# Patient Record
Sex: Female | Born: 1940 | Hispanic: No | Marital: Married | Smoking: Never smoker
Health system: Southern US, Community
[De-identification: ages and names within clinical notes are randomized; demographics above are authoritative.]

## PROBLEM LIST (undated history)

## (undated) DIAGNOSIS — K279 Peptic ulcer, site unspecified, unspecified as acute or chronic, without hemorrhage or perforation: Secondary | ICD-10-CM

## (undated) DIAGNOSIS — I2510 Atherosclerotic heart disease of native coronary artery without angina pectoris: Secondary | ICD-10-CM

## (undated) DIAGNOSIS — K219 Gastro-esophageal reflux disease without esophagitis: Secondary | ICD-10-CM

## (undated) DIAGNOSIS — I350 Nonrheumatic aortic (valve) stenosis: Secondary | ICD-10-CM

## (undated) DIAGNOSIS — R42 Dizziness and giddiness: Secondary | ICD-10-CM

## (undated) DIAGNOSIS — E785 Hyperlipidemia, unspecified: Secondary | ICD-10-CM

## (undated) DIAGNOSIS — I10 Essential (primary) hypertension: Secondary | ICD-10-CM

## (undated) HISTORY — DX: Nonrheumatic aortic (valve) stenosis: I35.0

## (undated) HISTORY — DX: Atherosclerotic heart disease of native coronary artery without angina pectoris: I25.10

## (undated) HISTORY — DX: Peptic ulcer, site unspecified, unspecified as acute or chronic, without hemorrhage or perforation: K27.9

## (undated) HISTORY — DX: Hyperlipidemia, unspecified: E78.5

## (undated) HISTORY — DX: Dizziness and giddiness: R42

## (undated) HISTORY — PX: PTCA: SHX146

## (undated) HISTORY — DX: Gastro-esophageal reflux disease without esophagitis: K21.9

## (undated) HISTORY — DX: Essential (primary) hypertension: I10

---

## 2003-11-19 ENCOUNTER — Encounter: Admission: RE | Admit: 2003-11-19 | Discharge: 2003-11-19 | Payer: Self-pay | Admitting: Internal Medicine

## 2003-12-10 ENCOUNTER — Ambulatory Visit (HOSPITAL_COMMUNITY): Admission: RE | Admit: 2003-12-10 | Discharge: 2003-12-10 | Payer: Self-pay | Admitting: Gastroenterology

## 2004-11-16 ENCOUNTER — Ambulatory Visit (HOSPITAL_COMMUNITY): Admission: RE | Admit: 2004-11-16 | Discharge: 2004-11-16 | Payer: Self-pay | Admitting: Ophthalmology

## 2007-05-17 ENCOUNTER — Other Ambulatory Visit: Admission: RE | Admit: 2007-05-17 | Discharge: 2007-05-17 | Payer: Self-pay | Admitting: Family Medicine

## 2007-06-10 ENCOUNTER — Emergency Department (HOSPITAL_COMMUNITY): Admission: EM | Admit: 2007-06-10 | Discharge: 2007-06-10 | Payer: Self-pay | Admitting: Emergency Medicine

## 2007-11-28 ENCOUNTER — Encounter: Payer: Self-pay | Admitting: Family Medicine

## 2008-01-16 ENCOUNTER — Encounter: Admission: RE | Admit: 2008-01-16 | Discharge: 2008-01-16 | Payer: Self-pay | Admitting: Cardiology

## 2008-01-22 ENCOUNTER — Inpatient Hospital Stay (HOSPITAL_COMMUNITY): Admission: AD | Admit: 2008-01-22 | Discharge: 2008-01-24 | Payer: Self-pay | Admitting: Cardiology

## 2008-01-22 ENCOUNTER — Inpatient Hospital Stay (HOSPITAL_BASED_OUTPATIENT_CLINIC_OR_DEPARTMENT_OTHER): Admission: RE | Admit: 2008-01-22 | Discharge: 2008-01-22 | Payer: Self-pay | Admitting: Cardiology

## 2008-06-09 ENCOUNTER — Inpatient Hospital Stay (HOSPITAL_COMMUNITY): Admission: EM | Admit: 2008-06-09 | Discharge: 2008-06-10 | Payer: Self-pay | Admitting: Emergency Medicine

## 2008-06-10 ENCOUNTER — Encounter (INDEPENDENT_AMBULATORY_CARE_PROVIDER_SITE_OTHER): Payer: Self-pay | Admitting: Internal Medicine

## 2008-11-20 ENCOUNTER — Encounter: Admission: RE | Admit: 2008-11-20 | Discharge: 2008-11-20 | Payer: Self-pay | Admitting: Family Medicine

## 2008-12-21 ENCOUNTER — Encounter: Payer: Self-pay | Admitting: Family Medicine

## 2008-12-22 ENCOUNTER — Encounter: Admission: RE | Admit: 2008-12-22 | Discharge: 2008-12-22 | Payer: Self-pay | Admitting: Gastroenterology

## 2009-02-05 ENCOUNTER — Encounter: Admission: RE | Admit: 2009-02-05 | Discharge: 2009-02-05 | Payer: Self-pay | Admitting: Family Medicine

## 2009-03-30 ENCOUNTER — Encounter: Admission: RE | Admit: 2009-03-30 | Discharge: 2009-03-30 | Payer: Self-pay | Admitting: Gastroenterology

## 2009-05-20 ENCOUNTER — Encounter: Payer: Self-pay | Admitting: Internal Medicine

## 2009-07-02 ENCOUNTER — Encounter: Payer: Self-pay | Admitting: Family Medicine

## 2009-07-27 ENCOUNTER — Encounter: Payer: Self-pay | Admitting: Internal Medicine

## 2009-08-05 ENCOUNTER — Inpatient Hospital Stay (HOSPITAL_COMMUNITY): Admission: EM | Admit: 2009-08-05 | Discharge: 2009-08-10 | Payer: Self-pay | Admitting: Emergency Medicine

## 2009-08-19 ENCOUNTER — Encounter: Payer: Self-pay | Admitting: Internal Medicine

## 2009-08-20 ENCOUNTER — Encounter: Payer: Self-pay | Admitting: Family Medicine

## 2009-08-20 ENCOUNTER — Ambulatory Visit: Payer: Self-pay | Admitting: Internal Medicine

## 2009-08-20 DIAGNOSIS — D649 Anemia, unspecified: Secondary | ICD-10-CM

## 2009-08-20 DIAGNOSIS — I2510 Atherosclerotic heart disease of native coronary artery without angina pectoris: Secondary | ICD-10-CM | POA: Insufficient documentation

## 2009-08-20 DIAGNOSIS — E785 Hyperlipidemia, unspecified: Secondary | ICD-10-CM

## 2009-08-20 DIAGNOSIS — K279 Peptic ulcer, site unspecified, unspecified as acute or chronic, without hemorrhage or perforation: Secondary | ICD-10-CM | POA: Insufficient documentation

## 2009-08-20 DIAGNOSIS — R740 Nonspecific elevation of levels of transaminase and lactic acid dehydrogenase [LDH]: Secondary | ICD-10-CM

## 2009-08-26 ENCOUNTER — Telehealth (INDEPENDENT_AMBULATORY_CARE_PROVIDER_SITE_OTHER): Payer: Self-pay | Admitting: *Deleted

## 2009-08-30 ENCOUNTER — Encounter: Payer: Self-pay | Admitting: Family Medicine

## 2009-08-30 ENCOUNTER — Encounter: Payer: Self-pay | Admitting: Internal Medicine

## 2009-09-01 ENCOUNTER — Ambulatory Visit (HOSPITAL_COMMUNITY): Admission: RE | Admit: 2009-09-01 | Discharge: 2009-09-01 | Payer: Self-pay | Admitting: Gastroenterology

## 2009-09-14 ENCOUNTER — Telehealth: Payer: Self-pay | Admitting: Internal Medicine

## 2009-09-17 ENCOUNTER — Ambulatory Visit: Payer: Self-pay | Admitting: Internal Medicine

## 2009-09-24 ENCOUNTER — Encounter (INDEPENDENT_AMBULATORY_CARE_PROVIDER_SITE_OTHER): Payer: Self-pay | Admitting: *Deleted

## 2010-01-07 ENCOUNTER — Ambulatory Visit: Payer: Self-pay | Admitting: Family Medicine

## 2010-01-07 DIAGNOSIS — I10 Essential (primary) hypertension: Secondary | ICD-10-CM

## 2010-01-10 ENCOUNTER — Ambulatory Visit: Payer: Self-pay | Admitting: Internal Medicine

## 2010-01-17 ENCOUNTER — Encounter: Payer: Self-pay | Admitting: Internal Medicine

## 2010-01-17 ENCOUNTER — Telehealth (INDEPENDENT_AMBULATORY_CARE_PROVIDER_SITE_OTHER): Payer: Self-pay | Admitting: *Deleted

## 2010-01-17 LAB — CONVERTED CEMR LAB
ALT: 23 units/L (ref 0–35)
AST: 29 units/L (ref 0–37)
Basophils Absolute: 0 10*3/uL (ref 0.0–0.1)
Basophils Relative: 0.5 % (ref 0.0–3.0)
Eosinophils Absolute: 0.3 10*3/uL (ref 0.0–0.7)
Eosinophils Relative: 5.7 % — ABNORMAL HIGH (ref 0.0–5.0)
Hemoglobin: 11.8 g/dL — ABNORMAL LOW (ref 12.0–15.0)
Lymphocytes Relative: 24.9 % (ref 12.0–46.0)
MCHC: 34.6 g/dL (ref 30.0–36.0)
Neutro Abs: 3.5 10*3/uL (ref 1.4–7.7)
Neutrophils Relative %: 58.3 % (ref 43.0–77.0)
Platelets: 249 10*3/uL (ref 150.0–400.0)

## 2010-01-19 ENCOUNTER — Telehealth: Payer: Self-pay | Admitting: Family Medicine

## 2010-01-28 ENCOUNTER — Ambulatory Visit: Payer: Self-pay | Admitting: Family Medicine

## 2010-01-28 DIAGNOSIS — M810 Age-related osteoporosis without current pathological fracture: Secondary | ICD-10-CM

## 2010-01-28 LAB — CONVERTED CEMR LAB
BUN: 26 mg/dL — ABNORMAL HIGH (ref 6–23)
Calcium: 9.2 mg/dL (ref 8.4–10.5)
Chloride: 106 meq/L (ref 96–112)
Creatinine, Ser: 1.2 mg/dL (ref 0.4–1.2)
Potassium: 4.1 meq/L (ref 3.5–5.1)
Sodium: 138 meq/L (ref 135–145)
TSH: 4.06 microintl units/mL (ref 0.35–5.50)

## 2010-02-01 ENCOUNTER — Telehealth (INDEPENDENT_AMBULATORY_CARE_PROVIDER_SITE_OTHER): Payer: Self-pay | Admitting: *Deleted

## 2010-02-01 ENCOUNTER — Encounter (INDEPENDENT_AMBULATORY_CARE_PROVIDER_SITE_OTHER): Payer: Self-pay | Admitting: *Deleted

## 2010-03-08 ENCOUNTER — Encounter: Payer: Self-pay | Admitting: Internal Medicine

## 2010-05-06 ENCOUNTER — Encounter: Payer: Self-pay | Admitting: Internal Medicine

## 2010-05-06 ENCOUNTER — Other Ambulatory Visit: Payer: Self-pay | Admitting: Internal Medicine

## 2010-05-06 ENCOUNTER — Ambulatory Visit
Admission: RE | Admit: 2010-05-06 | Discharge: 2010-05-06 | Payer: Self-pay | Source: Home / Self Care | Attending: Internal Medicine | Admitting: Internal Medicine

## 2010-05-06 LAB — CBC WITH DIFFERENTIAL/PLATELET
Basophils Absolute: 0.1 10*3/uL (ref 0.0–0.1)
Basophils Relative: 1 % (ref 0.0–3.0)
Eosinophils Relative: 2.8 % (ref 0.0–5.0)
HCT: 34.1 % — ABNORMAL LOW (ref 36.0–46.0)
Lymphs Abs: 1.3 10*3/uL (ref 0.7–4.0)
Monocytes Absolute: 0.5 10*3/uL (ref 0.1–1.0)
Monocytes Relative: 9.6 % (ref 3.0–12.0)
Neutro Abs: 3.4 10*3/uL (ref 1.4–7.7)
Platelets: 252 10*3/uL (ref 150.0–400.0)
RBC: 3.77 Mil/uL — ABNORMAL LOW (ref 3.87–5.11)
RDW: 12.9 % (ref 11.5–14.6)
WBC: 5.5 10*3/uL (ref 4.5–10.5)

## 2010-05-06 LAB — HEPATIC FUNCTION PANEL
Alkaline Phosphatase: 91 U/L (ref 39–117)
Bilirubin, Direct: 0.1 mg/dL (ref 0.0–0.3)
Total Bilirubin: 0.7 mg/dL (ref 0.3–1.2)

## 2010-05-06 LAB — LIPID PANEL
HDL: 47.6 mg/dL (ref >39.00)
Total CHOL/HDL Ratio: 5
VLDL: 36.6 mg/dL (ref 0.0–40.0)

## 2010-05-09 ENCOUNTER — Ambulatory Visit: Admit: 2010-05-09 | Payer: Self-pay | Admitting: Family Medicine

## 2010-05-09 ENCOUNTER — Encounter: Payer: Self-pay | Admitting: Internal Medicine

## 2010-05-11 NOTE — Progress Notes (Signed)
Summary: labs  Phone Note Outgoing Call   Call placed by: Doristine Devoid CMA,  February 01, 2010 2:38 PM Call placed to: Patient Summary of Call: pt's level is very low.  will need to start 50,000 units weekly x12 weeks and then recheck level   Follow-up for Phone Call        left message on machine ...Marland KitchenMarland KitchenDoristine Devoid CMA  February 01, 2010 2:38 PM   spoke w/ patient aware of labs and that prescription to be sent to pharmacy also mailed copy of labs .......Marland KitchenDoristine Devoid CMA  February 01, 2010 4:17 PM     New/Updated Medications: VITAMIN D (ERGOCALCIFEROL) 50000 UNIT CAPS (ERGOCALCIFEROL) take one tablet weekly x12 weeks Prescriptions: VITAMIN D (ERGOCALCIFEROL) 50000 UNIT CAPS (ERGOCALCIFEROL) take one tablet weekly x12 weeks  #12 x 0   Entered by:   Doristine Devoid CMA   Authorized by:   Neena Rhymes MD   Signed by:   Doristine Devoid CMA on 02/01/2010   Method used:   [facility] [phone][facility] (retail)       [address]       [place], [place]  [address]       Ph: [phone]       Fax: [phone]   RxID:   [id number]

## 2010-05-11 NOTE — Letter (Signed)
Summary: [facility] Physicians Office Visit Note   [facility] Office Visit Note   Imported By: Roderic Ovens 02/09/2010 15:44:29  _____________________________________________________________________  External Attachment:    Type:   Image     Comment:   External Document

## 2010-05-11 NOTE — Assessment & Plan Note (Signed)
Summary: rov/jss   Visit Type:  Follow-up Referring Provider:  Lewayne Bunting   History of Present Illness: Patient has a history of CAD.  She is s/p IWMI in 1995.  Underwent PTCA in [place]. She has been followed by [name] at [place].   In October 2009 she had no significant chest pain but fatigue, didn't feel well.  (This is how she felt in 1995).  She  had a myoview that showed inferior ischemia.  Cath showed:  LM:  normal; LAD:  calcified; LCx 80% distal.  RCA 80% distal before PDA.  She underwent PTCA/DES to the RCa.   She is recently  recuperating from a UGI bleed.    I saw her in mid May.  Since then she has  had another EGD that showed a small gastric erosion Since seen she denies chest pain.  Breathing is ok.  She plans to go to [place] on Sunday  Note that she has not resumed ASA, Plavix or Crestor.   Current Medications (verified): 1)  Pantoprazole Sodium 40 Mg Tbec (Pantoprazole Sodium) .... 1 Tab Two Times A Day 2)  Amlodipine Besylate 10 Mg Tabs (Amlodipine Besylate) .... 1 Tab Once Daily 3)  Ferrous Sulfate 325 (65 Fe) Mg Tabs (Ferrous Sulfate) .... 2 Tab Once Daily 4)  Metoprolol Tartrate 25 Mg Tabs (Metoprolol Tartrate) .... 1 Tab Once Daily 5)  Systane 0.4-0.3 % Soln (Polyethyl Glycol-Propyl Glycol) .... 1 Gtt Ou  Once Daily As Needed 6)  Vitamin A .... 1 Tab Once Daily 7)  Vitamin D 400 Unit Caps (Cholecalciferol) .... 1 Tab Once Daily  Allergies (verified): 1)  ! Simvastatin  Past History:  Past medical, surgical, family and social histories (including risk factors) reviewed, and no changes noted (except as noted below).  Past Medical History: Reviewed history from 08/20/2009 and no changes required.   PAST MEDICAL HISTORY:   1. Hypertension.   2. GERD.   3. CAD  4. Hyperlipidemia.   5. Mild MR,       aortic stenosis.   6. Peptic ulcer disease.   7. Vertigo.   Family History: Reviewed history from 08/19/2009 and no changes required. No family history of  colon cancer, stomach cancer or   any other GI process.   Social History: Reviewed history from 08/20/2009 and no changes required. the patient is married.   She   denies any smoking, drug use or alcohol.   Vital Signs:  Patient profile:   70 year old female Height:      64 inches Weight:      12 4 pounds Pulse rate:   67 / minute BP sitting:   137 / 72  (left arm) Cuff size:   regular  Vitals Entered By: Burnett Kanaris, CNA (September 17, 2009 4:17 PM)  Physical Exam  Additional Exam:  patient is in NAD HEENT:  Normocephalic, atraumatic. EOMI, PERRLA.  Neck: JVP is normal. No thyromegaly. No bruits.  Lungs: clear to auscultation. No rales no wheezes.  Heart: Regular rate and rhythm. Normal S1, S2. No S3.   No significant murmurs. PMI not displaced.  Abdomen:  Supple, nontender. Normal bowel sounds. No masses. No hepatomegaly.  Extremities:   Good distal pulses throughout. No lower extremity edema.  Musculoskeletal :moving all extremities.  Neuro:   alert and oriented x3.    Impression & Recommendations:  Problem # 1:  CAD, NATIVE VESSEL (ICD-414.01) No signs of angina.  I spoke to Dr. Dulce Sellar.  It is OK  to resume ASA 81 mg.  No Plavix. Doing better on 5 mg Amlodipine.  NOt dizzy.  Problem # 2:  HYPERLIPIDEMIA-MIXED (ICD-272.4) I would not resume Crestor until she gets back.  Transaminases will need to be checked prior.  Patient Instructions: 1)  Your physician recommends that you return for lab work in: LFT and CBC . PT. WILL CALL THE OFFICE WHRN HE RETURS FROM A TRIP TO SCHEDULE BLOOD WORK. 2)  New medication: Nitroglycerin o.4 mg  SL as needed for chest pain and Amlodipine 5 mg. Take one tablet  by mouth daily. Prescriptions: AMLODIPINE BESYLATE 5 MG TABS (AMLODIPINE BESYLATE) Take one tablet by mouth daily  #30 x 6   Entered by:   Ollen Gross, RN, BSN   Authorized by:   Sherrill Raring, MD, [facility]   Signed by:   Ollen Gross, RN, BSN on 09/17/2009   Method used:    Electronically to        [facility] [phone]* (retail)       [address]       [place], [place]  [address]       Ph: [phone]       Fax: [phone]   RxID:   [id number] NITROSTAT 0.4 MG SUBL (NITROGLYCERIN) 1 tablet under tongue at onset of chest pain; you may repeat every 5 minutes for up to 3 doses.  #25 x 3   Entered by:   Ollen Gross, RN, BSN   Authorized by:   Sherrill Raring, MD, [facility]   Signed by:   Ollen Gross, RN, BSN on 09/17/2009   Method used:   Electronically to        [facility] [phone]* (retail)       [address]       [place], [place]  [address]       Ph: [phone]       Fax: [phone]   RxID:   [id number]

## 2010-05-11 NOTE — Assessment & Plan Note (Signed)
Summary: 3 month rck/mt   Visit Type:  Follow-up Referring Provider:  Lewayne Bunting   History of Present Illness: Lisa Murillo has a history of CAD.  She is s/p IWMI in 1995.  Underwent PTCA in [place]. She has been followed by [name] at [place].   In October 2009 she had no significant chest pain but fatigue, didn't feel well.  (This is how she felt in 1995).  She  had a myoview that showed inferior ischemia.  Cath showed:  LM:  normal; LAD:  calcified; LCx 80% distal.  RCA 80% distal before PDA.  She underwent PTCA/DES to the RCa.   She is recently  recuperating from a UGI bleed.    I saw her in mid June  At that time I put her back on ASA   Since then she and her husband traveled to [place].  Unfortunately trip was complicated by his suffering a bowel obstruction and requiring surgery.  they ended up staying 3 months. She denies chest pains.  No signif SOB.  SHe is a little tired but they got back from [place] several days ago.  Current Medications (verified): 1)  Pantoprazole Sodium 40 Mg Tbec (Pantoprazole Sodium) .Marland Kitchen.. 1 Tab Two Times A Day 2)  Amlodipine Besylate 5 Mg Tabs (Amlodipine Besylate) .... Take One Tablet By Mouth Daily 3)  Ferrous Sulfate 325 (65 Fe) Mg Tabs (Ferrous Sulfate) .... 2 Tab Once Daily 4)  Metoprolol Tartrate 25 Mg Tabs (Metoprolol Tartrate) .Marland Kitchen.. 1 Tab Once Daily 5)  Systane 0.4-0.3 % Soln (Polyethyl Glycol-Propyl Glycol) .Marland Kitchen.. 1 Gtt Ou  Once Daily As Needed 6)  Vitamin A .Marland Kitchen.. 1 Tab Once Daily 7)  Vitamin D 400 Unit Caps (Cholecalciferol) .Marland Kitchen.. 1 Tab Once Daily 8)  Nitrostat 0.4 Mg Subl (Nitroglycerin) .Marland Kitchen.. 1 Tablet Under Tongue At Onset of Chest Pain; You May Repeat Every 5 Minutes For Up To 3 Doses.  Allergies (verified): 1)  ! Simvastatin  Past History:  Past Medical History: Last updated: 08/20/2009   PAST MEDICAL HISTORY:   1. Hypertension.   2. GERD.   3. CAD  4. Hyperlipidemia.   5. Mild MR,       aortic stenosis.   6. Peptic ulcer disease.   7. Vertigo.     Family History: Last updated: 08/19/2009 No family history of colon cancer, stomach cancer or   any other GI process.   Social History: Last updated: 08/20/2009 the Lisa Murillo is married.   She   denies any smoking, drug use or alcohol.   Vital Signs:  Lisa Murillo profile:   70 year old female Height:      64 inches Weight:      129 pounds Pulse rate:   58 / minute BP sitting:   146 / 73  (left arm) Cuff size:   regular  Vitals Entered By: Burnett Kanaris, CNA (January 10, 2010 2:22 PM)  Physical Exam  Additional Exam:  Lisa Murillo is a petite 70 year old in NAD HEENT:  Normocephalic, atraumatic. EOMI, PERRLA.  Neck: JVP is normal. No thyromegaly. No bruits.  Lungs: clear to auscultation. No rales no wheezes.  Heart: Regular rate and rhythm. Normal S1, S2. No S3.   No significant murmurs. PMI not displaced.  Abdomen:  Supple, nontender. Normal bowel sounds. No masses. No hepatomegaly.  Extremities:   Good distal pulses throughout. No lower extremity edema.  Musculoskeletal :moving all extremities.  Neuro:   alert and oriented x3.    Impression & Recommendations:  Problem # 1:  CAD, NATIVE VESSEL (ICD-414.01) Doing fairly well.  I would keep on same regimen.  Problem # 2:  ANEMIA (ICD-285.9) Repeat CBC today.  Problem # 3:  TRANSAMINASES, SERUM, ELEVATED (ICD-790.4)  will check todaybefore thinking about restarting statin.  Other Orders: TLB-CBC Platelet - w/Differential (85025-CBCD) TLB-Hepatic/Liver Function Pnl (80076-HEPATIC)  Lisa Murillo Instructions: 1)  Your physician recommends that you return for lab work in: lab work today...we will call you with results 2)  Your physician wants you to follow-up in:4 months   You will receive a reminder letter in the mail two months in advance. If you don't receive a letter, please call our office to schedule the follow-up appointment. Prescriptions: PANTOPRAZOLE SODIUM 40 MG TBEC (PANTOPRAZOLE SODIUM) 1 tab one time a day  #30 x 6    Entered by:   Layne Benton, RN, BSN   Authorized by:   Sherrill Raring, MD, [facility]   Signed by:   Layne Benton, RN, BSN on 01/10/2010   Method used:   Electronically to        [facility] [phone]* (retail)       [address]       [place], [place]  [address]       Ph: [phone]       Fax: [phone]   RxID:   [id number]

## 2010-05-11 NOTE — Miscellaneous (Signed)
Summary: [facility]  [facility]   Imported By: Lanelle Bal 01/28/2010 10:47:20  _____________________________________________________________________  External Attachment:    Type:   Image     Comment:   External Document

## 2010-05-11 NOTE — Letter (Signed)
Summary: Primary Care Consult Scheduled Letter  [facility] [place]  [address] [place], [place] [address]   Phone: [phone]  Fax: [phone]      02/01/2010 MRN: [id number]  Lisa Murillo [address] [place], [place]  [address]    Dear [name],    We have scheduled an appointment for you.  At the recommendation of Dr. Neena Rhymes, we have scheduled you for a Bone Density with [facility] on 05-09-2010 at 9:30am.  Their address is [address], [place], [place] [address]. The office phone number is [phone].  If this appointment day and time is not convenient for you, please feel free to call the office of the doctor you are being referred to at the number listed above and reschedule the appointment.    It is important for you to keep your scheduled appointments. We are here to make sure you are given good patient care.   Thank you,    Renee, Patient Care Coordinator [place] at [facility]

## 2010-05-11 NOTE — Progress Notes (Signed)
----   Converted from flag ---- ---- 08/24/2009 4:34 PM, Sherrill Raring, MD, [facility] wrote: GI needs to repeat liver enzymes.  When they are back to normal can resume statin and check LFTs and lipid panel 8 wks after.  ---- 08/23/2009 11:41 AM, Layne Benton, RN, BSN wrote: When do you want her scheduled for Lipid and Liver Panel?? ------------------------------       Additional Follow-up for Phone Call Additional follow up Details #2::    Dr.Ross has advised me that the patient will have her GI doctor send [place] a note when her liver enzymes are normal so she can restart the statin and then we can go ahead and schedule her labs.   Follow-up by: Suzan Garibaldi RN

## 2010-05-11 NOTE — Letter (Signed)
Summary: Appointment - Reminder 2  [facility], Main Office  [address]   [place], [place] [address]   Phone: [phone]  Fax: [phone]     September 24, 2009 MRN: [id number]   Lisa Murillo [address] [place], [place]  [address]   Dear Ms. Borboa,  Our records indicate that it is time to schedule a follow-up appointment with Dr. Tenny Craw in August. It is very important that we reach you to schedule this appointment. We look forward to participating in your health care needs. Please contact us at the number listed above at your earliest convenience to schedule your appointment.  If you are unable to make an appointment at this time, give [place] a call so we can update our records.     Sincerely,   Migdalia Dk [facility] Scheduling Team

## 2010-05-11 NOTE — Assessment & Plan Note (Signed)
Summary: NEW PT TO ESTAB///SPH   Vital Signs:  Patient profile:   70 year old female Height:      64 inches (162.56 cm) Weight:      128.13 pounds (58.24 kg) BMI:     22.07 Temp:     98.0 degrees F (36.67 degrees C) oral BP sitting:   120 / 72  (left arm) Cuff size:   regular  Vitals Entered By: Lucious Groves CMA (January 07, 2010 9:56 AM) CC: New to est--was unhappy with previous MD./kb Is Patient Diabetic? No Pain Assessment Patient in pain? no      Comments Patient would like to discuss her history and exams that may be needed. She notes that Dr Evette Cristal and Dr. Valentina Lucks with [facility] will have her records./kb   History of Present Illness: 70 yo woman here today to establish care.  previous MD- Valentina Lucks at [facility].  1) PUD- had GI bleed, tx'd by Dr Dulce Sellar earlier this year.  now on Protonix two times a day.  uncertain if f/u needed.  bleed was most likely related to plavix use.  denies recent dark, tarry stools or BRBPR  2) HTN- taking amlodipine and the metoprolol w/ good control.  denies CP, SOB, HAs, visual changes, edema  3) CAD- s/p PTCA in [place], on metoprolol.  now seeing Dr Tenny Craw.  4) Hyperlipidemia- date of last labs unknown.  previously on Crestor.  need to review labs, no records available.  Preventive Screening-Counseling & Management  Alcohol-Tobacco     Alcohol drinks/day: 0     Smoking Status: never  Caffeine-Diet-Exercise     Does Patient Exercise: no      Sexual History:  currently monogamous.        Drug Use:  never.    Current Medications (verified): 1)  Pantoprazole Sodium 40 Mg Tbec (Pantoprazole Sodium) .Marland Kitchen.. 1 Tab Two Times A Day 2)  Amlodipine Besylate 5 Mg Tabs (Amlodipine Besylate) .... Take One Tablet By Mouth Daily 3)  Ferrous Sulfate 325 (65 Fe) Mg Tabs (Ferrous Sulfate) .... 2 Tab Once Daily 4)  Metoprolol Tartrate 25 Mg Tabs (Metoprolol Tartrate) .Marland Kitchen.. 1 Tab Once Daily 5)  Systane 0.4-0.3 % Soln (Polyethyl Glycol-Propyl Glycol) .Marland Kitchen.. 1 Gtt Ou   Once Daily As Needed 6)  Vitamin A .Marland Kitchen.. 1 Tab Once Daily 7)  Vitamin D 400 Unit Caps (Cholecalciferol) .Marland Kitchen.. 1 Tab Once Daily 8)  Nitrostat 0.4 Mg Subl (Nitroglycerin) .Marland Kitchen.. 1 Tablet Under Tongue At Onset of Chest Pain; You May Repeat Every 5 Minutes For Up To 3 Doses.  Allergies (verified): 1)  ! Simvastatin  Past History:  Past Medical History: Last updated: 08/20/2009   PAST MEDICAL HISTORY:   1. Hypertension.   2. GERD.   3. CAD  4. Hyperlipidemia.   5. Mild MR,       aortic stenosis.   6. Peptic ulcer disease.   7. Vertigo.   Past Surgical History: PTCA  Social History: the patient is married.  splits time here and in [place] (outside of [place]) 2 children- son lives locally when parents are in town, daughter in [place] She denies any smoking, drug use or alcohol. Smoking Status:  never Does Patient Exercise:  no Sexual History:  currently monogamous Drug Use:  never  Review of Systems      See HPI  Physical Exam  General:  thin but otherwise well appearing Head:  NCAT Eyes:  PERRL, EOMI Neck:  No deformities, masses, or tenderness noted. Lungs:  Normal respiratory effort, chest expands symmetrically. Lungs are clear to auscultation, no crackles or wheezes. Heart:  reg S1/S2 Abdomen:  soft, NT/ND, +BS Pulses:  +2 carotid, radial, DP Extremities:  no C/C/E Neurologic:  alert & oriented X3 and cranial nerves II-XII intact.   Cervical Nodes:  No lymphadenopathy noted Psych:  Oriented X3, normally interactive, good eye contact, not anxious appearing, and not depressed appearing.     Impression & Recommendations:  Problem # 1:  PUD (ICD-533.90) pt following w/ Dr Evette Cristal and Dr Dulce Sellar.  she wasn't sure if GI f/u was required.  will need to review chart.  pt currently on PPI two times a day.  no longer on plavix. Her updated medication list for this problem includes:    Pantoprazole Sodium 40 Mg Tbec (Pantoprazole sodium) .Marland Kitchen... 1 tab one time a day  Problem # 2:   HYPERTENSION, BENIGN ESSENTIAL (ICD-401.1) Assessment: New pt's BP well controlled on current meds.  asymptomatic.  no changes at this time. Her updated medication list for this problem includes:    Amlodipine Besylate 5 Mg Tabs (Amlodipine besylate) .Marland Kitchen... Take one tablet by mouth daily    Metoprolol Tartrate 25 Mg Tabs (Metoprolol tartrate) .Marland Kitchen... 1 tab once daily  Problem # 3:  CAD, NATIVE VESSEL (ICD-414.01) Assessment: Unchanged following w/ Dr Tenny Craw.  s/p PTCA Her updated medication list for this problem includes:    Amlodipine Besylate 5 Mg Tabs (Amlodipine besylate) .Marland Kitchen... Take one tablet by mouth daily    Metoprolol Tartrate 25 Mg Tabs (Metoprolol tartrate) .Marland Kitchen... 1 tab once daily    Nitrostat 0.4 Mg Subl (Nitroglycerin) .Marland Kitchen... 1 tablet under tongue at onset of chest pain; you may repeat every 5 minutes for up to 3 doses.  Problem # 4:  HYPERLIPIDEMIA-MIXED (ICD-272.4) previously on crestor but this was stopped- pt thinks due to increased LFTs.  will need to review records to determine next step.  Pt expresses understanding and is in agreement w/ this plan.  Complete Medication List: 1)  Pantoprazole Sodium 40 Mg Tbec (Pantoprazole sodium) .Marland Kitchen.. 1 tab one time a day 2)  Amlodipine Besylate 5 Mg Tabs (Amlodipine besylate) .... Take one tablet by mouth daily 3)  Ferrous Sulfate 325 (65 Fe) Mg Tabs (Ferrous sulfate) .... 2 tab once daily 4)  Metoprolol Tartrate 25 Mg Tabs (Metoprolol tartrate) .Marland Kitchen.. 1 tab once daily 5)  Systane 0.4-0.3 % Soln (Polyethyl glycol-propyl glycol) .Marland Kitchen.. 1 gtt ou  once daily as needed 6)  Vitamin A  .Marland Kitchen.. 1 tab once daily 7)  Vitamin D 400 Unit Caps (Cholecalciferol) .Marland Kitchen.. 1 tab once daily 8)  Nitrostat 0.4 Mg Subl (Nitroglycerin) .Marland Kitchen.. 1 tablet under tongue at onset of chest pain; you may repeat every 5 minutes for up to 3 doses.  Other Orders: Flu Vaccine 14yrs + MEDICARE PATIENTS (E4540) Administration Flu vaccine - MCR (J8119)  Patient Instructions: 1)  Please  schedule your complete physical in 3 weeks- do not eat before this appt so we can get your labs 2)  We'll get your records and review them 3)  If the do not arrive in the next 10 days- we'll be in touch 4)  Welcome!  We're glad to have you!       Flu Vaccine Consent Questions     Do you have a history of severe allergic reactions to this vaccine? no    Any prior history of allergic reactions to egg and/or gelatin? no    Do you have a sensitivity  to the preservative Thimersol? no    Do you have a past history of Guillan-Barre Syndrome? no    Do you currently have an acute febrile illness? no    Have you ever had a severe reaction to latex? no    Vaccine information given and explained to patient? yes    Are you currently pregnant? no    Lot Number:[id number]   Exp Date:10/08/2010   Site Given  Left Deltoid IMdflu

## 2010-05-11 NOTE — Progress Notes (Signed)
Summary: Records from [place]   Patient spouse notified. Lucious Groves CMA  January 19, 2010 2:25 PM     ---- Converted from flag ---- ---- 01/07/2010 10:30 AM, Neena Rhymes MD wrote: please call pt and let them know whether records have arrived.  if not, they want to go to the old office and pick them up.  thanks! ------------------------------

## 2010-05-11 NOTE — Miscellaneous (Signed)
  Clinical Lists Changes  Medications: Added new medication of CRESTOR 5 MG TABS (ROSUVASTATIN CALCIUM) one half tab every day - Signed Rx of CRESTOR 5 MG TABS (ROSUVASTATIN CALCIUM) one half tab every day;  #30 x 2;  Signed;  Entered by: Layne Benton, RN, BSN;  Authorized by: Sherrill Raring, MD, [facility];  Method used: [facility] [phone]*, [address] [place], [place], [place]  [address], Ph: [phone], Fax: [phone]    Prescriptions: CRESTOR 5 MG TABS (ROSUVASTATIN CALCIUM) one half tab every day  #30 x 2   Entered by:   Layne Benton, RN, BSN   Authorized by:   Sherrill Raring, MD, [facility]   Signed by:   Layne Benton, RN, BSN on 01/17/2010   Method used:   Electronically to        [facility] [phone]* (retail)       [address]       [place], [place]  [address]       Ph: [phone]       Fax: [phone]   RxID:   [id number]

## 2010-05-11 NOTE — Procedures (Signed)
Summary: Upper GI Endoscopy/[facility]  Upper GI Endoscopy/[facility]   Imported By: Lanelle Bal 02/07/2010 13:54:47  _____________________________________________________________________  External Attachment:    Type:   Image     Comment:   External Document

## 2010-05-11 NOTE — Progress Notes (Signed)
Summary: records  Phone Note Call from Patient Call back at Home Phone [phone]   Caller: Spouse Reason for Call: Talk to Nurse Summary of Call: pt did see Dr Dulce Sellar, wants to make we have records for appt on 6/10 Initial call taken by: Migdalia Dk,  September 14, 2009 9:26 AM  Follow-up for Phone Call        Called patient and left message on machine that we did not receive any records from her doctor. Follow-up by: Suzan Garibaldi RN

## 2010-05-11 NOTE — Procedures (Signed)
Summary: Colonoscopy/[facility]  Colonoscopy/[facility]   Imported By: Lanelle Bal 02/07/2010 13:59:51  _____________________________________________________________________  External Attachment:    Type:   Image     Comment:   External Document

## 2010-05-11 NOTE — Letter (Signed)
Summary: Appointment - Reminder 2  [facility], Main Office  [address]   [place], [place] [address]   Phone: [phone]  Fax: [phone]     September 24, 2009 MRN: [id number]   Lisa Murillo [address] [place], [place]  [address]   Dear Ms. Dudash,  Our records indicate that it is time to schedule a follow-up appointment with Dr. Ross in August. It is very important that we reach you to schedule this appointment. We look forward to participating in your health care needs. Please contact us at the number listed above at your earliest convenience to schedule your appointment.  If you are unable to make an appointment at this time, give us a call so we can update our records.     Sincerely,   [facility]ennifer [facility] Scheduling Team 

## 2010-05-11 NOTE — Assessment & Plan Note (Signed)
Summary: cpx /lab/cbs   Vital Signs:  Patient profile:   70 year old female Height:      64 inches Weight:      125 pounds BMI:     21.53 Temp:     98.1 degrees F oral Pulse rate:   73 / minute BP sitting:   110 / 70  (left arm)  Vitals Entered By: Doristine Devoid CMA (January 28, 2010 9:57 AM) CC: yearly exam and labs    History of Present Illness: 70 yo woman here today for CPE.  sore throat- has had sore throat for 2 days, chills.  yesterday started to feel better.  today, no complaints.  Here for Medicare AWV:  1.   Risk factors based on Past M, S, F history: Hyperlipidemia- recently started on Crestor 2.5 mg as directed by cards. HTN- excellent control on Metoprolol and Amlodipine PUD- on protonix daily, has stopped plavix.  Dr Dulce Sellar indicated she should f/u as needed. Osteoporosis- previously on Fosamax, this caused dizziness.  due for bone density. 2.   Physical Activities: walking, housework 3.   Depression/mood: denies depression 4.   Hearing: normal to whispered voice at 6 ft 5.   ADL's: independent 6.   Fall Risk: low risk 7.   Home Safety: feels safe at home, lives w/ husband 8.   Height, weight, &visual acuity: see vitals, vision corrected to 20/20 w/ reading glasses 9.   Counseling: provided on health maintainence, regular exercise, healthy diet 10.   Labs ordered based on risk factors: see A&P 11.           Referral Coordination: see A&P 12.           Care Plan- UTD on mammogram (done in [place]), colonosocpy, has not had bone density test 13.           Cognitive Assessment: linear thought process, memory intact, oriented X3  Preventive Screening-Counseling & Management  Alcohol-Tobacco     Smoking Status: never      Sexual History:  currently monogamous.        Drug Use:  never.    Current Medications (verified): 1)  Pantoprazole Sodium 40 Mg Tbec (Pantoprazole Sodium) .Marland Kitchen.. 1 Tab One Time A Day 2)  Amlodipine Besylate 5 Mg Tabs (Amlodipine Besylate)  .... Take One Tablet By Mouth Daily 3)  Ferrous Sulfate 325 (65 Fe) Mg Tabs (Ferrous Sulfate) .... 2 Tab Once Daily 4)  Metoprolol Tartrate 25 Mg Tabs (Metoprolol Tartrate) .Marland Kitchen.. 1 Tab Once Daily 5)  Systane 0.4-0.3 % Soln (Polyethyl Glycol-Propyl Glycol) .Marland Kitchen.. 1 Gtt Ou  Once Daily As Needed 6)  Vitamin A .Marland Kitchen.. 1 Tab Once Daily 7)  Vitamin D 400 Unit Caps (Cholecalciferol) .Marland Kitchen.. 1 Tab Once Daily 8)  Nitrostat 0.4 Mg Subl (Nitroglycerin) .Marland Kitchen.. 1 Tablet Under Tongue At Onset of Chest Pain; You May Repeat Every 5 Minutes For Up To 3 Doses. 9)  Crestor 5 Mg Tabs (Rosuvastatin Calcium) .... One Half Tab Every Day 10)  Ecotrin Low Strength 81 Mg Tbec (Aspirin) .... Take One Tablet Daily  Allergies (verified): 1)  ! Simvastatin  Past History:  Past medical, surgical, family and social histories (including risk factors) reviewed, and no changes noted (except as noted below).  Past Medical History:   PAST MEDICAL HISTORY:   1. Hypertension.   2. GERD.   3. CAD  4. Hyperlipidemia.   5. Mild MR,       aortic stenosis.   6. Peptic ulcer  disease.   7. Vertigo.   8. Osteoporosis  Past Surgical History: Reviewed history from 01/07/2010 and no changes required. PTCA  Family History: Reviewed history from 08/19/2009 and no changes required. No family history of colon cancer, stomach cancer or   any other GI process.   Social History: Reviewed history from 01/07/2010 and no changes required. the patient is married.  splits time here and in [place] (outside of [place]) 2 children- son lives locally when parents are in town, daughter in [place] She denies any smoking, drug use or alcohol.   Review of Systems  The patient denies anorexia, fever, weight loss, weight gain, vision loss, decreased hearing, hoarseness, chest pain, syncope, dyspnea on exertion, peripheral edema, prolonged cough, headaches, abdominal pain, melena, hematochezia, severe indigestion/heartburn, hematuria, suspicious skin  lesions, depression, abnormal bleeding, enlarged lymph nodes, and breast masses.    Physical Exam  General:  thin but otherwise well appearing Head:  NCAT Eyes:  PERRL, EOMI, fundoscopic exam deferred to ophtho Ears:  External ear exam shows no significant lesions or deformities.  Otoscopic examination reveals clear canals, tympanic membranes are intact bilaterally without bulging, retraction, inflammation or discharge. Hearing is grossly normal bilaterally. Nose:  External nasal examination shows no deformity or inflammation. Nasal mucosa are pink and moist without lesions or exudates. Mouth:  Oral mucosa and oropharynx without lesions or exudates.  Teeth in good repair. Neck:  No deformities, masses, or tenderness noted. Breasts:  deferred Lungs:  Normal respiratory effort, chest expands symmetrically. Lungs are clear to auscultation, no crackles or wheezes. Heart:  reg S1/S2, II/VI SEM Abdomen:  soft, NT/ND, +BS Genitalia:  deferred Pulses:  +2 carotid, radial, DP Extremities:  no C/C/E Neurologic:  No cranial nerve deficits noted. Station and gait are normal. Plantar reflexes are down-going bilaterally. DTRs are symmetrical throughout. Sensory, motor and coordinative functions appear intact. Skin:  Intact without suspicious lesions or rashes Cervical Nodes:  No lymphadenopathy noted Axillary Nodes:  No palpable lymphadenopathy Psych:  Cognition and judgment appear intact. Alert and cooperative with normal attention span and concentration. No apparent delusions, illusions, hallucinations   Impression & Recommendations:  Problem # 1:  PHYSICAL EXAMINATION (ICD-V70.0) Assessment New  pt's PE WNL.  UTD on pap and mammogram per report (has these done in [place]).  will plan on bone density when she returns from [place].  has had flu and pneumovax.  UTD on colonoscopy.  anticipatory guidance provided.  Orders: Medicare -1st Annual Wellness Visit [phone])  Problem # 2:  OSTEOPOROSIS  (ICD-733.00) Assessment: New will check Vit D level.  will arrange DEXA for pt when she returns from [place]. Her updated medication list for this problem includes:    Vitamin D 400 Unit Caps (Cholecalciferol) .Marland Kitchen... 1 tab once daily  Orders: Venipuncture (60454) T-Vitamin D (25-Hydroxy) (09811-91478) Specimen Handling (29562) Radiology Referral (Radiology)  Problem # 3:  HYPERTENSION, BENIGN ESSENTIAL (ICD-401.1) Assessment: Unchanged BP excellently controlled today.  check labs.  no changes. Her updated medication list for this problem includes:    Amlodipine Besylate 5 Mg Tabs (Amlodipine besylate) .Marland Kitchen... Take one tablet by mouth daily    Metoprolol Tartrate 25 Mg Tabs (Metoprolol tartrate) .Marland Kitchen... 1 tab once daily  Orders: Specimen Handling (13086) TLB-BMP (Basic Metabolic Panel-BMET) (80048-METABOL) TLB-TSH (Thyroid Stimulating Hormone) (84443-TSH)  Problem # 4:  PUD (ICD-533.90) Assessment: Unchanged according to Dr Hulen Shouts last note follow up will be as needed.  currently asymptomatic.  will follow. Her updated medication list for this problem includes:  Pantoprazole Sodium 40 Mg Tbec (Pantoprazole sodium) .Marland Kitchen... 1 tab one time a day  Problem # 5:  HYPERLIPIDEMIA-MIXED (ICD-272.4) Assessment: Unchanged pt recently started on statin by cards.  will have f/u LFTs done in [place]. Her updated medication list for this problem includes:    Crestor 5 Mg Tabs (Rosuvastatin calcium) ..... One half tab every day  Complete Medication List: 1)  Pantoprazole Sodium 40 Mg Tbec (Pantoprazole sodium) .Marland Kitchen.. 1 tab one time a day 2)  Amlodipine Besylate 5 Mg Tabs (Amlodipine besylate) .... Take one tablet by mouth daily 3)  Ferrous Sulfate 325 (65 Fe) Mg Tabs (Ferrous sulfate) .... 2 tab once daily 4)  Metoprolol Tartrate 25 Mg Tabs (Metoprolol tartrate) .Marland Kitchen.. 1 tab once daily 5)  Systane 0.4-0.3 % Soln (Polyethyl glycol-propyl glycol) .Marland Kitchen.. 1 gtt ou  once daily as needed 6)  Vitamin A  .Marland Kitchen.. 1  tab once daily 7)  Vitamin D 400 Unit Caps (Cholecalciferol) .Marland Kitchen.. 1 tab once daily 8)  Nitrostat 0.4 Mg Subl (Nitroglycerin) .Marland Kitchen.. 1 tablet under tongue at onset of chest pain; you may repeat every 5 minutes for up to 3 doses. 9)  Crestor 5 Mg Tabs (Rosuvastatin calcium) .... One half tab every day 10)  Ecotrin Low Strength 81 Mg Tbec (Aspirin) .... Take one tablet daily  Patient Instructions: 1)  Please schedule a follow-up appointment in 6 months to recheck blood pressure and cholesterol, sooner if needed. 2)  We'll notify you of your lab results 3)  Keep up the good work!  Your exam looks great! 4)  We'll plan on scheduling a bone density test when you return from [place] 5)  Have a safe trip and a wonderful holiday season!    Orders Added: 1)  Venipuncture [36415] 2)  T-Vitamin D (25-Hydroxy) [phone] 3)  Specimen Handling [99000] 4)  TLB-BMP (Basic Metabolic Panel-BMET) [80048-METABOL] 5)  TLB-TSH (Thyroid Stimulating Hormone) [84443-TSH] 6)  Radiology Referral [Radiology] 7)  Medicare -1st Annual Wellness Visit [G0438] 8)  Est. Patient Level III [91478]   Immunization History:  Pneumovax Immunization History:    Pneumovax:  historical (04/11/2007)   Immunization History:  Pneumovax Immunization History:    Pneumovax:  Historical (04/11/2007)     Preventive Care Screening  Colonoscopy:    Date:  12/10/2003    Results:  normal   Last Pneumovax:    Date:  04/11/2007    Results:  Historical

## 2010-05-11 NOTE — Letter (Signed)
Summary: [facility] Visit Note   [facility] Office Visit Note   Imported By: Roderic Ovens 02/09/2010 15:44:59  _____________________________________________________________________  External Attachment:    Type:   Image     Comment:   External Document

## 2010-05-11 NOTE — Progress Notes (Signed)
  Pt signed ROI, faxed to [facility],recieved records back today gave to [facility]  January 17, 2010 11:33 AM

## 2010-05-11 NOTE — Assessment & Plan Note (Signed)
Summary: np6/jss   Visit Type:  new pt visit Referring Provider:  Lewayne Bunting   History of Present Illness: Lisa Murillo is a 70 year old who presents for a second opinion Patient has a history of CAD.  She is s/p IWMI in 1995.  Underwent PTCA in [place]. She has been followed by [name] at [place].   In October 2009 she had no significant chest pain but fatigue, didn't feel well.  (This is how she felt in 1995).  She  had a myoview that showed inferior ischemia.  Cath showed:  LM:  normal; LAD:  calcified; LCx 80% distal.  RCA 80% distal before PDA.  She underwent PTCA/DES to the RCa.   She was just discharged from [facility].  On May 3rd her Hgb was 6.8.  Endoscopy showed evidence of bleeding in her stomach teated with epi.  She is due to be followed by Dr. Dulce Sellar on 5/23. She was transfused.  Her liver enzymes were elevated and she was taken off Crestor.  Note that she did not have any significant bump in her cardiac enzymes. She was sent home off of ASA and Plavix. Since d/c she says she is slowly improving.  She denies chest pain.  Energy is improving.  Current Medications (verified): 1)  Pantoprazole Sodium 40 Mg Tbec (Pantoprazole Sodium) .Marland Kitchen.. 1 Tab Two Times A Day 2)  Amlodipine Besylate 10 Mg Tabs (Amlodipine Besylate) .Marland Kitchen.. 1 Tab Once Daily 3)  Ferrous Sulfate 325 (65 Fe) Mg Tabs (Ferrous Sulfate) .... 2 Tab Once Daily 4)  Metoprolol Tartrate 25 Mg Tabs (Metoprolol Tartrate) .Marland Kitchen.. 1 Tab Once Daily 5)  Systane 0.4-0.3 % Soln (Polyethyl Glycol-Propyl Glycol) .Marland Kitchen.. 1 Gtt Ou  Once Daily As Needed 6)  Vitamin A .Marland Kitchen.. 1 Tab Once Daily 7)  Vitamin D 400 Unit Caps (Cholecalciferol) .Marland Kitchen.. 1 Tab Once Daily  Allergies (verified): 1)  ! Simvastatin  Past History:  Past Medical History:   PAST MEDICAL HISTORY:   1. Hypertension.   2. GERD.   3. CAD  4. Hyperlipidemia.   5. Mild MR,       aortic stenosis.   6. Peptic ulcer disease.   7. Vertigo.   Social History: the patient  is married.   She   denies any smoking, drug use or alcohol.   Review of Systems       All systems reviewed.  Negative to the above problem except as noted above.  Vital Signs:  Patient profile:   70 year old female Height:      64 inches Weight:      123 pounds BMI:     21.19 Pulse rate:   77 / minute Pulse rhythm:   irregular BP sitting:   100 / 70  (left arm) Cuff size:   large  Vitals Entered By: Danielle Rankin, CMA (Aug 20, 2009 3:23 PM)  Physical Exam  Additional Exam:  patient is in NAD. HEENT:  Normocephalic, atraumatic. EOMI, PERRLA.  Neck: JVP is normal. No thyromegaly. No bruits.  Lungs: clear to auscultation. No rales no wheezes.  Heart: Regular rate and rhythm. Normal S1, S2. No S3.   Gr I-II/VI systolic murmur PMI not displaced.  Abdomen:  Supple, nontender. Normal bowel sounds. No masses. No hepatomegaly.  Extremities:   Good distal pulses throughout. No lower extremity edema.  Musculoskeletal :moving all extremities.  Neuro:   alert and oriented x3.    EKG  Procedure date:  08/20/2009  Findings:  NSR.  77 bpm.  T wave inversion V1-V3.  Impression & Recommendations:  Problem # 1:  PUD (ICD-533.90) Patient with recent GI bleed.  Had been on ASA and Plavix.  Both are now being held She is due to be seen in GI by Dr. Dulce Sellar.  It sounds like she may undergo repeat endoscopy.   Results will determine when she can restart ASA.  Problem # 2:  TRANSAMINASES, SERUM, ELEVATED (ICD-790.4) This will need to be checked again.  If near normal needs to get back on Crestor  Problem # 3:  CAD, NATIVE VESSEL (ICD-414.01) Fortunately she tolerated recent severe anemia without developing unstable angina. Will need to resume ASA and Crestor when possible. BP is a little low today.   She still feels weak.   I told her to decrease amlodipine to 5 mg.  Follow bp. Her updated medication list for this problem includes:    Amlodipine Besylate 10 Mg Tabs (Amlodipine  besylate) .Marland Kitchen... 1 tab once daily    Metoprolol Tartrate 25 Mg Tabs (Metoprolol tartrate) .Marland Kitchen... 1 tab once daily  Problem # 4:  HYPERLIPIDEMIA-MIXED (ICD-272.4) As noted above.  Await repeat labs.  Patient Instructions: 1)  Decrease amlodipine to 5 mg 2)  Keep check of blood pressure.

## 2010-05-12 NOTE — Assessment & Plan Note (Signed)
Summary: 4 month rov.sl   Visit Type:  4 month rov Referring Provider:  Lewayne Bunting  CC:  fatigued.  History of Present Illness: Patient has a history of CAD.  She is s/p IWMI in 1995.  Underwent PTCA in [place]. She has been followed by [name] at [place].   In October 2009 she had no significant chest pain but fatigue, didn't feel well.  (This is how she felt in 1995).  She  had a myoview that showed inferior ischemia.  Cath showed:  LM:  normal; LAD:  calcified; LCx 80% distal.  RCA 80% distal before PDA.  She underwent PTCA/DES to the RCa.   She is recently  recuperating from a UGI bleed.  Current Medications (verified): 1)  Pantoprazole Sodium 40 Mg Tbec (Pantoprazole Sodium) .Marland Kitchen.. 1 Tab One Time A Day 2)  Amlodipine Besylate 5 Mg Tabs (Amlodipine Besylate) .... Take One Tablet By Mouth Daily 3)  Ferrous Sulfate 325 (65 Fe) Mg Tabs (Ferrous Sulfate) .... 2 Tab Once Daily 4)  Metoprolol Tartrate 25 Mg Tabs (Metoprolol Tartrate) .Marland Kitchen.. 1 Tab Once Daily 5)  Systane 0.4-0.3 % Soln (Polyethyl Glycol-Propyl Glycol) .Marland Kitchen.. 1 Gtt Ou  Once Daily As Needed 6)  Vitamin A .Marland Kitchen.. 1 Tab Once Daily 7)  Vitamin D 400 Unit Caps (Cholecalciferol) .Marland Kitchen.. 1 Tab Once Daily 8)  Nitrostat 0.4 Mg Subl (Nitroglycerin) .Marland Kitchen.. 1 Tablet Under Tongue At Onset of Chest Pain; You May Repeat Every 5 Minutes For Up To 3 Doses. 9)  Crestor 5 Mg Tabs (Rosuvastatin Calcium) .... One Half Tab Every Day 10)  Ecotrin Low Strength 81 Mg Tbec (Aspirin) .... Take One Tablet Daily 11)  Vitamin D (Ergocalciferol) 50000 Unit Caps (Ergocalciferol) .... Take One Tablet Weekly X12 Weeks  Allergies (verified): 1)  ! Simvastatin  Review of Systems       Reviewed.  NEg to hte above problem except as noted above.  Vital Signs:  Patient profile:   70 year old female Height:      64 inches Weight:      132.50 pounds BMI:     22.83 Pulse rate:   68 / minute BP sitting:   140 / 72  (left arm) Cuff size:   regular  Vitals Entered By:  Caralee Ates CMA (May 06, 2010 10:22 AM)  Physical Exam  Additional Exam:  Patinet is a petite 70 year old in NAD HEENT:  Normocephalic, atraumatic. EOMI, PERRLA.  Neck: JVP is normal. No thyromegaly. No bruits.  Lungs: clear to auscultation. No rales no wheezes.  Heart: Regular rate and rhythm. Normal S1, S2. No S3.   No significant murmurs. PMI not displaced.  Abdomen:  Supple, nontender. Normal bowel sounds. No masses. No hepatomegaly.  Extremities:   Good distal pulses throughout. No lower extremity edema.  Musculoskeletal :moving all extremities.  Neuro:   alert and oriented x3.    EKG  Procedure date:  05/06/2010  Findings:      NSR.  68 bpm    Impression & Recommendations:  Problem # 1:  CAD, NATIVE VESSEL (ICD-414.01) No symptoms to suggest angina.  Keep on current regimen  Problem # 2:  ANEMIA (ICD-285.9) With fatigue will check CBC  Problem # 3:  HYPERLIPIDEMIA-MIXED (ICD-272.4) Now on crestor.  Will check fasting lipids and liver  Problem # 4:  HYPERTENSION, BENIGN ESSENTIAL (ICD-401.1) Keep on current regimen Her updated medication list for this problem includes:    Amlodipine Besylate 5 Mg Tabs (Amlodipine besylate) .Marland KitchenMarland KitchenMarland KitchenMarland Kitchen  Take one tablet by mouth daily    Metoprolol Tartrate 25 Mg Tabs (Metoprolol tartrate) .Marland Kitchen... 1 tab once daily    Ecotrin Low Strength 81 Mg Tbec (Aspirin) .Marland Kitchen... Take one tablet daily  Problem # 5:  TRANSAMINASES, SERUM, ELEVATED (ICD-790.4) Check labs.  Other Orders: EKG w/ Interpretation (93000) TLB-Lipid Panel (80061-LIPID) TLB-CBC Platelet - w/Differential (85025-CBCD) TLB-Hepatic/Liver Function Pnl (80076-HEPATIC)  Patient Instructions: 1)  Your physician recommends that you return for lab work in: lab work today..we will call you with results 2)  Your physician wants you to follow-up in: 7 months with Dr.Ross  You will receive a reminder letter in the mail two months in advance. If you don't receive a letter, please call our  office to schedule the follow-up appointment.

## 2010-05-18 NOTE — Miscellaneous (Signed)
  Clinical Lists Changes  Medications: Removed medication of CRESTOR 5 MG TABS (ROSUVASTATIN CALCIUM) one half tab every day Added new medication of CRESTOR 10 MG TABS (ROSUVASTATIN CALCIUM) 1 tab every day - Signed Rx of CRESTOR 10 MG TABS (ROSUVASTATIN CALCIUM) 1 tab every day;  #30 x 6;  Signed;  Entered by: Layne Benton, RN, BSN;  Authorized by: Sherrill Raring, MD, [employer];  Method used: [facility] to [facility] [phone]*, [address] [place], [place], [place]  [address], Ph: [phone], Fax: [phone]    Prescriptions: CRESTOR 10 MG TABS (ROSUVASTATIN CALCIUM) 1 tab every day  #30 x 6   Entered by:   Layne Benton, RN, BSN   Authorized by:   Sherrill Raring, MD, [facility]   Signed by:   Layne Benton, RN, BSN on 05/09/2010   Method used:   Electronically to        [facility] [phone]* (retail)       [address]       [place], [place]  [address]       Ph: [phone]       Fax: [phone]   RxID:   [id number]

## 2010-05-25 ENCOUNTER — Encounter: Payer: Self-pay | Admitting: Family Medicine

## 2010-05-25 ENCOUNTER — Ambulatory Visit (INDEPENDENT_AMBULATORY_CARE_PROVIDER_SITE_OTHER)
Admission: RE | Admit: 2010-05-25 | Discharge: 2010-05-25 | Disposition: A | Discharge status: Home / Self Care | Payer: Self-pay | Source: Ambulatory Visit | Attending: Family Medicine | Admitting: Family Medicine

## 2010-05-25 ENCOUNTER — Other Ambulatory Visit: Payer: Self-pay | Admitting: Family Medicine

## 2010-05-25 ENCOUNTER — Inpatient Hospital Stay: Admission: RE | Admit: 2010-05-25 | Payer: Self-pay | Source: Ambulatory Visit

## 2010-05-25 DIAGNOSIS — M810 Age-related osteoporosis without current pathological fracture: Secondary | ICD-10-CM

## 2010-06-01 NOTE — Miscellaneous (Signed)
Summary: dexa and vertebral assessment order  Clinical Lists Changes  Orders: Added new Test order of T-Bone Densitometry [phone]) - Signed Added new Test order of T-Lumbar Vertebral Assessment [phone]) - Signed

## 2010-06-03 ENCOUNTER — Telehealth: Payer: Self-pay | Admitting: Internal Medicine

## 2010-06-03 ENCOUNTER — Telehealth (INDEPENDENT_AMBULATORY_CARE_PROVIDER_SITE_OTHER): Payer: Self-pay | Admitting: *Deleted

## 2010-06-07 NOTE — Progress Notes (Signed)
Summary: refill request  Phone Note Refill Request Message from:  Patient on June 03, 2010 8:59 AM  Refills Requested: Medication #1:  AMLODIPINE BESYLATE 5 MG TABS Take one tablet by mouth daily [facility] [phone]/pt going [place] of [place]-needs 3 month supply   Method Requested: Telephone to Pharmacy Initial call taken by: Glynda Jaeger,  June 03, 2010 8:59 AM    Prescriptions: AMLODIPINE BESYLATE 5 MG TABS (AMLODIPINE BESYLATE) Take one tablet by mouth daily  #90 x 3   Entered by:   Burnett Kanaris, CNA   Authorized by:   Sherrill Raring, MD, [facility]   Signed by:   Burnett Kanaris, CNA on 06/03/2010   Method used:   Electronically to        [facility] [phone]* (retail)       [address]       [place], [place]  [address]       Ph: [phone]       Fax: [phone]   RxID:   [id number]

## 2010-06-07 NOTE — Progress Notes (Addendum)
Summary: dexa results-  Phone Note Outgoing Call   Call placed by: Doristine Devoid CMA,  June 03, 2010 1:09 PM Call placed to: Patient Summary of Call: pt has osteoporosis based on DEXA- needs to start Fosamax 70mg  weekly or can be set up for once yearly reclast infusion for treatment   Follow-up for Phone Call        left message on machine ....Marland KitchenMarland KitchenDoristine Devoid CMA  June 03, 2010 1:09 PM   Additional Follow-up for Phone Call Additional follow up Details #1::        Pt return call and would like to do reclast. Pt notes that she has tried the fosamax previously and it caused her to be dizzy. Pt is to leave the country for a month on monday and will call to let us to know when she is back........Marland KitchenFelecia Deloach CMA  June 03, 2010 4:12 PM

## 2010-06-28 LAB — COMPREHENSIVE METABOLIC PANEL
ALT: 163 U/L — ABNORMAL HIGH (ref 0–35)
ALT: 261 U/L — ABNORMAL HIGH (ref 0–35)
AST: 150 U/L — ABNORMAL HIGH (ref 0–37)
AST: 179 U/L — ABNORMAL HIGH (ref 0–37)
AST: 302 U/L — ABNORMAL HIGH (ref 0–37)
Albumin: 2.9 g/dL — ABNORMAL LOW (ref 3.5–5.2)
Albumin: 3 g/dL — ABNORMAL LOW (ref 3.5–5.2)
Albumin: 3.3 g/dL — ABNORMAL LOW (ref 3.5–5.2)
Alkaline Phosphatase: 51 U/L (ref 39–117)
Alkaline Phosphatase: 58 U/L (ref 39–117)
Alkaline Phosphatase: 96 U/L (ref 39–117)
BUN: 22 mg/dL (ref 6–23)
BUN: 33 mg/dL — ABNORMAL HIGH (ref 6–23)
BUN: 45 mg/dL — ABNORMAL HIGH (ref 6–23)
CO2: 20 mEq/L (ref 19–32)
CO2: 21 mEq/L (ref 19–32)
Calcium: 7.7 mg/dL — ABNORMAL LOW (ref 8.4–10.5)
Calcium: 8.9 mg/dL (ref 8.4–10.5)
Chloride: 114 mEq/L — ABNORMAL HIGH (ref 96–112)
Chloride: 110 mEq/L (ref 96–112)
Chloride: 117 mEq/L — ABNORMAL HIGH (ref 96–112)
Creatinine, Ser: 1.1 mg/dL (ref 0.4–1.2)
Creatinine, Ser: 0.96 mg/dL (ref 0.4–1.2)
Creatinine, Ser: 1.29 mg/dL — ABNORMAL HIGH (ref 0.4–1.2)
GFR calc Af Amer: >60 mL/min (ref >60)
GFR calc Af Amer: 50 mL/min — ABNORMAL LOW (ref >60)
GFR calc Af Amer: >60 mL/min (ref >60)
GFR calc non Af Amer: 49 mL/min — ABNORMAL LOW (ref >60)
GFR calc non Af Amer: 41 mL/min — ABNORMAL LOW (ref >60)
Glucose, Bld: 99 mg/dL (ref 70–99)
Glucose, Bld: 137 mg/dL — ABNORMAL HIGH (ref 70–99)
Potassium: 2.9 mEq/L — ABNORMAL LOW (ref 3.5–5.1)
Potassium: 3.4 mEq/L — ABNORMAL LOW (ref 3.5–5.1)
Potassium: 4.5 mEq/L (ref 3.5–5.1)
Sodium: 137 mEq/L (ref 135–145)
Sodium: 136 mEq/L (ref 135–145)
Total Bilirubin: 1.5 mg/dL — ABNORMAL HIGH (ref 0.3–1.2)
Total Bilirubin: 0.9 mg/dL (ref 0.3–1.2)
Total Bilirubin: 1.4 mg/dL — ABNORMAL HIGH (ref 0.3–1.2)
Total Protein: 4.9 g/dL — ABNORMAL LOW (ref 6.0–8.3)
Total Protein: 6.5 g/dL (ref 6.0–8.3)

## 2010-06-28 LAB — DIFFERENTIAL
Basophils Absolute: 0 10*3/uL (ref 0.0–0.1)
Basophils Absolute: 0 10*3/uL (ref 0.0–0.1)
Basophils Absolute: 0 10*3/uL (ref 0.0–0.1)
Basophils Relative: 0 % (ref 0–1)
Lymphocytes Relative: 22 % (ref 12–46)
Lymphocytes Relative: 28 % (ref 12–46)
Lymphs Abs: 1.5 10*3/uL (ref 0.7–4.0)
Monocytes Absolute: 0.5 10*3/uL (ref 0.1–1.0)
Monocytes Absolute: 0.6 10*3/uL (ref 0.1–1.0)
Neutro Abs: 3.3 10*3/uL (ref 1.7–7.7)
Neutro Abs: 4.8 10*3/uL (ref 1.7–7.7)
Neutro Abs: 4.8 10*3/uL (ref 1.7–7.7)
Neutrophils Relative %: 60 % (ref 43–77)

## 2010-06-28 LAB — CARDIAC PANEL(CRET KIN+CKTOT+MB+TROPI)
CK, MB: 3.3 ng/mL (ref 0.3–4.0)
Relative Index: 2.6 — ABNORMAL HIGH (ref 0.0–2.5)
Total CK: 125 U/L (ref 7–177)
Total CK: 139 U/L (ref 7–177)
Troponin I: 0.01 ng/mL (ref 0.00–0.06)
Troponin I: 0.01 ng/mL (ref 0.00–0.06)

## 2010-06-28 LAB — CBC
HCT: 31.7 % — ABNORMAL LOW (ref 36.0–46.0)
HCT: 29.3 % — ABNORMAL LOW (ref 36.0–46.0)
Hemoglobin: 10.9 g/dL — ABNORMAL LOW (ref 12.0–15.0)
Hemoglobin: 12.1 g/dL (ref 12.0–15.0)
Hemoglobin: 11.9 g/dL — ABNORMAL LOW (ref 12.0–15.0)
Hemoglobin: 9.4 g/dL — ABNORMAL LOW (ref 12.0–15.0)
MCHC: 35.2 g/dL (ref 30.0–36.0)
MCHC: 35.4 g/dL (ref 30.0–36.0)
MCHC: 34.6 g/dL (ref 30.0–36.0)
MCHC: 35.7 g/dL (ref 30.0–36.0)
MCV: 90.2 fL (ref 78.0–100.0)
MCV: 90.7 fL (ref 78.0–100.0)
Platelets: 151 10*3/uL (ref 150–400)
Platelets: 144 10*3/uL — ABNORMAL LOW (ref 150–400)
Platelets: 215 10*3/uL (ref 150–400)
RBC: 3.51 MIL/uL — ABNORMAL LOW (ref 3.87–5.11)
RBC: 3.61 MIL/uL — ABNORMAL LOW (ref 3.87–5.11)
RBC: 3.77 MIL/uL — ABNORMAL LOW (ref 3.87–5.11)
RBC: 3.69 MIL/uL — ABNORMAL LOW (ref 3.87–5.11)
RDW: 15.6 % — ABNORMAL HIGH (ref 11.5–15.5)
RDW: 15.6 % — ABNORMAL HIGH (ref 11.5–15.5)
RDW: 12.6 % (ref 11.5–15.5)
RDW: 16.2 % — ABNORMAL HIGH (ref 11.5–15.5)
WBC: 4.9 10*3/uL (ref 4.0–10.5)
WBC: 5.5 10*3/uL (ref 4.0–10.5)

## 2010-06-28 LAB — CROSSMATCH: DAT, IgG: NEGATIVE

## 2010-06-28 LAB — IRON AND TIBC
Saturation Ratios: 42 % (ref 20–55)
UIBC: 128 ug/dL

## 2010-06-28 LAB — URINE MICROSCOPIC-ADD ON

## 2010-06-28 LAB — BASIC METABOLIC PANEL
BUN: 12 mg/dL (ref 6–23)
BUN: 14 mg/dL (ref 6–23)
CO2: 21 mEq/L (ref 19–32)
CO2: 22 mEq/L (ref 19–32)
Calcium: 8.3 mg/dL — ABNORMAL LOW (ref 8.4–10.5)
Calcium: 8 mg/dL — ABNORMAL LOW (ref 8.4–10.5)
Chloride: 112 mEq/L (ref 96–112)
Chloride: 113 mEq/L — ABNORMAL HIGH (ref 96–112)
Creatinine, Ser: 1.06 mg/dL (ref 0.4–1.2)
Creatinine, Ser: 1.06 mg/dL (ref 0.4–1.2)
GFR calc Af Amer: 58 mL/min — ABNORMAL LOW (ref >60)
GFR calc non Af Amer: 48 mL/min — ABNORMAL LOW (ref >60)
Glucose, Bld: 106 mg/dL — ABNORMAL HIGH (ref 70–99)
Glucose, Bld: 96 mg/dL (ref 70–99)
Glucose, Bld: 90 mg/dL (ref 70–99)
Potassium: 3.7 mEq/L (ref 3.5–5.1)
Potassium: 3.8 mEq/L (ref 3.5–5.1)
Sodium: 138 mEq/L (ref 135–145)

## 2010-06-28 LAB — URINALYSIS, ROUTINE W REFLEX MICROSCOPIC
Bilirubin Urine: NEGATIVE
Glucose, UA: NEGATIVE mg/dL
Hgb urine dipstick: NEGATIVE
Ketones, ur: NEGATIVE mg/dL
Nitrite: NEGATIVE
Protein, ur: 30 mg/dL — AB
Specific Gravity, Urine: 1.024 (ref 1.005–1.030)
Urobilinogen, UA: 0.2 mg/dL (ref 0.0–1.0)
pH: 6 (ref 5.0–8.0)

## 2010-06-28 LAB — LACTIC ACID, PLASMA: Lactic Acid, Venous: 1 mmol/L (ref 0.5–2.2)

## 2010-06-28 LAB — MAGNESIUM: Magnesium: 1.8 mg/dL (ref 1.5–2.5)

## 2010-06-28 LAB — MRSA PCR SCREENING: MRSA by PCR: NEGATIVE

## 2010-06-28 LAB — HEMOCCULT GUIAC POC 1CARD (OFFICE)
Fecal Occult Bld: POSITIVE
Fecal Occult Bld: POSITIVE

## 2010-06-28 LAB — CK TOTAL AND CKMB (NOT AT ARMC)
CK, MB: 4.2 ng/mL — ABNORMAL HIGH (ref 0.3–4.0)
Relative Index: 2.5 (ref 0.0–2.5)

## 2010-06-28 LAB — ANTI-NUCLEAR AB-TITER (ANA TITER): ANA Titer 1: 1:80 {titer} — ABNORMAL HIGH

## 2010-06-28 LAB — HEPATITIS B SURFACE ANTIBODY,QUALITATIVE: Hep B S Ab: NEGATIVE

## 2010-06-28 LAB — TROPONIN I: Troponin I: 0.01 ng/mL (ref 0.00–0.06)

## 2010-06-28 LAB — HEPATITIS C ANTIBODY: HCV Ab: NEGATIVE

## 2010-06-28 LAB — FOLATE: Folate: 15 ng/mL

## 2010-06-28 LAB — RETICULOCYTES: Retic Count, Absolute: 30.9 10*3/uL (ref 19.0–186.0)

## 2010-06-28 LAB — HEMOGLOBIN AND HEMATOCRIT, BLOOD
HCT: 33.5 % — ABNORMAL LOW (ref 36.0–46.0)
Hemoglobin: 12.1 g/dL (ref 12.0–15.0)
Hemoglobin: 12.9 g/dL (ref 12.0–15.0)
Hemoglobin: 6.8 g/dL — CL (ref 12.0–15.0)

## 2010-07-18 ENCOUNTER — Telehealth: Payer: Self-pay | Admitting: Family Medicine

## 2010-07-18 NOTE — Telephone Encounter (Signed)
Patient's son stopped by about info for his dad and his mom---they are back from their trip out of country ---this patient needs to followup on her bone density---earlier phone note in  EMR mentions that patient wants to pursue the reclast--son asks that we call her to talk about bone density tests; Son needs to know if there is any other followup visit that Dr Beverely Low wants (we have note on 01/28/2010 that patient needs a follow-up appointment in 6 months to recheck blood pressure and cholesterol)

## 2010-07-19 NOTE — Telephone Encounter (Signed)
Left message to call office.Pt due for 6 month f/u now.Reclast paperwork started.

## 2010-07-20 NOTE — Telephone Encounter (Signed)
Pt aware appt scheduled awaiting insurance verification

## 2010-07-21 LAB — LIPID PANEL
Cholesterol: 112 mg/dL (ref 0–200)
HDL: 41 mg/dL (ref >39)
Total CHOL/HDL Ratio: 2.7 RATIO
Triglycerides: 87 mg/dL (ref <150)

## 2010-07-21 LAB — CARDIAC PANEL(CRET KIN+CKTOT+MB+TROPI)
Relative Index: 0.7 (ref 0.0–2.5)
Total CK: 216 U/L — ABNORMAL HIGH (ref 7–177)
Total CK: 294 U/L — ABNORMAL HIGH (ref 7–177)
Troponin I: <0.01 ng/mL (ref 0.00–0.06)

## 2010-07-21 LAB — COMPREHENSIVE METABOLIC PANEL
ALT: 27 U/L (ref 0–35)
AST: 31 U/L (ref 0–37)
CO2: 21 mEq/L (ref 19–32)
Chloride: 106 mEq/L (ref 96–112)
GFR calc Af Amer: 44 mL/min — ABNORMAL LOW (ref >60)
GFR calc non Af Amer: 36 mL/min — ABNORMAL LOW (ref >60)
Potassium: 4.2 mEq/L (ref 3.5–5.1)
Sodium: 135 mEq/L (ref 135–145)
Total Bilirubin: 0.9 mg/dL (ref 0.3–1.2)

## 2010-07-21 LAB — PROTIME-INR: Prothrombin Time: 13.5 seconds (ref 11.6–15.2)

## 2010-07-21 LAB — FOLATE: Folate: >20 ng/mL

## 2010-07-21 LAB — POCT CARDIAC MARKERS
CKMB, poc: 5.6 ng/mL (ref 1.0–8.0)
Myoglobin, poc: 205 ng/mL (ref 12–200)
Myoglobin, poc: 214 ng/mL (ref 12–200)
Troponin i, poc: <0.05 ng/mL (ref 0.00–0.09)

## 2010-07-21 LAB — DIFFERENTIAL
Eosinophils Relative: 1 % (ref 0–5)
Lymphocytes Relative: 16 % (ref 12–46)
Lymphs Abs: 1.1 10*3/uL (ref 0.7–4.0)
Monocytes Absolute: 0.4 10*3/uL (ref 0.1–1.0)
Monocytes Relative: 6 % (ref 3–12)

## 2010-07-21 LAB — VITAMIN B12: Vitamin B-12: 267 pg/mL (ref 211–911)

## 2010-07-21 LAB — URINALYSIS, ROUTINE W REFLEX MICROSCOPIC
Bilirubin Urine: NEGATIVE
Ketones, ur: NEGATIVE mg/dL
Nitrite: NEGATIVE
pH: 6.5 (ref 5.0–8.0)

## 2010-07-21 LAB — BASIC METABOLIC PANEL
CO2: 19 mEq/L (ref 19–32)
Calcium: 8 mg/dL — ABNORMAL LOW (ref 8.4–10.5)
Creatinine, Ser: 1.27 mg/dL — ABNORMAL HIGH (ref 0.4–1.2)
GFR calc Af Amer: 51 mL/min — ABNORMAL LOW (ref >60)
Sodium: 135 mEq/L (ref 135–145)

## 2010-07-21 LAB — POCT I-STAT, CHEM 8
BUN: 35 mg/dL — ABNORMAL HIGH (ref 6–23)
Creatinine, Ser: 1.7 mg/dL — ABNORMAL HIGH (ref 0.4–1.2)
Sodium: 140 mEq/L (ref 135–145)
TCO2: 20 mmol/L (ref 0–100)

## 2010-07-21 LAB — CBC
HCT: 30.2 % — ABNORMAL LOW (ref 36.0–46.0)
Hemoglobin: 10.5 g/dL — ABNORMAL LOW (ref 12.0–15.0)
RBC: 3.42 MIL/uL — ABNORMAL LOW (ref 3.87–5.11)
RDW: 12.6 % (ref 11.5–15.5)
WBC: 7.2 10*3/uL (ref 4.0–10.5)

## 2010-07-21 LAB — APTT: aPTT: 32 seconds (ref 24–37)

## 2010-07-21 LAB — MAGNESIUM: Magnesium: 2.4 mg/dL (ref 1.5–2.5)

## 2010-07-21 LAB — RETICULOCYTES: Retic Ct Pct: 1.1 % (ref 0.4–3.1)

## 2010-07-22 ENCOUNTER — Other Ambulatory Visit (INDEPENDENT_AMBULATORY_CARE_PROVIDER_SITE_OTHER): Payer: Medicare Other

## 2010-07-22 DIAGNOSIS — E782 Mixed hyperlipidemia: Secondary | ICD-10-CM

## 2010-07-22 LAB — LIPID PANEL
Cholesterol: 228 mg/dL — ABNORMAL HIGH (ref 0–200)
HDL: 53.3 mg/dL (ref >39.00)
Triglycerides: 179 mg/dL — ABNORMAL HIGH (ref 0.0–149.0)

## 2010-07-22 LAB — AST: AST: 29 U/L (ref 0–37)

## 2010-07-25 ENCOUNTER — Encounter: Payer: Self-pay | Admitting: *Deleted

## 2010-07-25 ENCOUNTER — Encounter: Payer: Self-pay | Admitting: Family Medicine

## 2010-07-29 ENCOUNTER — Ambulatory Visit (INDEPENDENT_AMBULATORY_CARE_PROVIDER_SITE_OTHER): Payer: Medicare Other | Admitting: Family Medicine

## 2010-07-29 VITALS — BP 120/80 | Temp 98.4°F | Wt 134.5 lb

## 2010-07-29 DIAGNOSIS — E785 Hyperlipidemia, unspecified: Secondary | ICD-10-CM

## 2010-07-29 DIAGNOSIS — M25569 Pain in unspecified knee: Secondary | ICD-10-CM

## 2010-07-29 DIAGNOSIS — I10 Essential (primary) hypertension: Secondary | ICD-10-CM

## 2010-07-29 DIAGNOSIS — M810 Age-related osteoporosis without current pathological fracture: Secondary | ICD-10-CM

## 2010-07-29 MED ORDER — ROSUVASTATIN CALCIUM 10 MG PO TABS
10.0000 mg | ORAL_TABLET | Freq: Every day | ORAL | Status: DC
Start: 2010-07-29 — End: 2010-08-15

## 2010-07-29 NOTE — Progress Notes (Signed)
  Subjective:    Patient ID: Lisa Murillo, female    DOB: 02-Feb-1941, 70 y.o.   MRN: [id number]  HPI HTN- chronic problem for pt, well controlled on amlodipine, toprol.  no CP, SOB, HAs, visual changes, edema.  L knee pain- pain in anterior knee.  No swelling or redness.  Had increased walking while in [place].  No bruise.  Pain has been present for 2-3 weeks.  Hyperlipidemia- chronic problem for pt, meds were increased to 10mg  crestor in Jan, no abd pain, N/V.  Some bloating- increased after Crestor dose was increased.  Osteoporosis- forms for Reclast were completed.  Awaiting insurance approval.  Review of Systems For ROS see HPI     Objective:   Physical Exam  Constitutional: She is oriented to person, place, and time. She appears well-developed and well-nourished. No distress.  HENT:  Head: Normocephalic and atraumatic.  Eyes: Conjunctivae and EOM are normal. Pupils are equal, round, and reactive to light.  Neck: Normal range of motion. Neck supple. No thyromegaly present.  Cardiovascular: Normal rate, regular rhythm, normal heart sounds and intact distal pulses.   No murmur heard. Pulmonary/Chest: Effort normal and breath sounds normal. No respiratory distress.  Abdominal: Soft. She exhibits no distension. There is no tenderness.  Musculoskeletal: She exhibits no edema.       Left knee: She exhibits swelling. She exhibits normal range of motion, no deformity, no erythema and normal patellar mobility. tenderness found. Medial joint line tenderness noted. No lateral joint line, no MCL, no LCL and no patellar tendon tenderness noted.       Legs: Lymphadenopathy:    She has no cervical adenopathy.  Neurological: She is alert and oriented to person, place, and time.  Skin: Skin is warm and dry.  Psychiatric: She has a normal mood and affect. Her behavior is normal.          Assessment & Plan:

## 2010-07-29 NOTE — Patient Instructions (Signed)
Follow up in 6 months for your complete physical Call with any questions or concerns We'll forward your lab results to Dr Tenny Craw As soon as we hear from the insurance company we'll let you know about the Reclast Take tylenol for your knee pain and ice for pain relief You look fantastic!!!  Keep up the good work!!!

## 2010-08-01 ENCOUNTER — Encounter: Payer: Self-pay | Admitting: Family Medicine

## 2010-08-01 DIAGNOSIS — M25569 Pain in unspecified knee: Secondary | ICD-10-CM | POA: Insufficient documentation

## 2010-08-01 NOTE — Assessment & Plan Note (Signed)
Pt's lipids have improved since Crestor was increased in January.  Will forward results to Dr Tenny Craw to make sure she is aware.  Will defer changes in management to her as they have been working together on this.

## 2010-08-01 NOTE — Assessment & Plan Note (Signed)
Waiting on insurance to approve reclast injxn.

## 2010-08-01 NOTE — Assessment & Plan Note (Signed)
BP is excellently controlled.  Currently asymptomatic.  Will follow.

## 2010-08-01 NOTE — Assessment & Plan Note (Signed)
Pt's medial joint line tenderness of L knee is consistent w/ osteoarthritis.  Pain in office today is not severe, ambulating well.  Advised heat/ice, elevation, tylenol or ibuprofen prn.  Will follow.

## 2010-08-12 ENCOUNTER — Telehealth: Payer: Self-pay | Admitting: *Deleted

## 2010-08-12 NOTE — Telephone Encounter (Signed)
Pt Reclast is approved needs BMEt prior to infusion. Left message to call the office.

## 2010-08-15 ENCOUNTER — Telehealth: Payer: Self-pay | Admitting: *Deleted

## 2010-08-15 ENCOUNTER — Other Ambulatory Visit (INDEPENDENT_AMBULATORY_CARE_PROVIDER_SITE_OTHER): Payer: Medicare Other

## 2010-08-15 DIAGNOSIS — M810 Age-related osteoporosis without current pathological fracture: Secondary | ICD-10-CM

## 2010-08-15 DIAGNOSIS — E782 Mixed hyperlipidemia: Secondary | ICD-10-CM

## 2010-08-15 LAB — BUN: BUN: 19 mg/dL (ref 6–23)

## 2010-08-15 MED ORDER — ROSUVASTATIN CALCIUM 20 MG PO TABS: 20.0000 mg | ORAL_TABLET | Freq: Every day | ORAL | Status: DC

## 2010-08-15 NOTE — Telephone Encounter (Signed)
Pt and spouse notified of pt Reclast appt at [facility] on 09/07/10 at 2 pm. Pt is aware to drink at least two glasses of water a few hours prior and to be at admitting at 1:45 pm. Pt expressed understanding and will call for sooner appt so she can get back to [place].

## 2010-08-15 NOTE — Telephone Encounter (Signed)
Patient aware to increase Crestor to 20mg  every day.

## 2010-08-18 ENCOUNTER — Ambulatory Visit (HOSPITAL_COMMUNITY): Payer: Medicare Other | Attending: Family Medicine

## 2010-08-18 DIAGNOSIS — M810 Age-related osteoporosis without current pathological fracture: Secondary | ICD-10-CM | POA: Insufficient documentation

## 2010-08-23 NOTE — Cardiovascular Report (Signed)
NAMEJERRA, HUCKEBY            ACCOUNT NO.:  [id number]   MEDICAL RECORD NO.:  [id number]          PATIENT TYPE:  INP   LOCATION:  6527                         FACILITY:  [facility]   PHYSICIAN:  Armanda Magic, M.D.     DATE OF BIRTH:  April 30, 1940   DATE OF PROCEDURE:  01/23/2008  DATE OF DISCHARGE:  01/24/2008                            CARDIAC CATHETERIZATION   REFERRING PHYSICIAN:  Consuella Lose C. Valentina Lucks, MD   PROCEDURE:  Right coronary artery angiography.   OPERATOR:  Armanda Magic, MD and Francisca December, MD   COMPLICATIONS:  None.   IV ACCESS:  Via right femoral artery 6-French sheath.   This a 70 year old female who was admitted for cardiac catheterization  yesterday and we were unable to cannulate the right coronary artery.  She now presents for repeat cath.  We then upgraded for sheath to a 6-  French sheath to try to enable better access to the right coronary  artery.   The patient was brought to cardiac catheterization laboratory in a  fasting nonsedated state.  Informed consent was obtained.  The patient  was connected to continuous heart rate and pulse oximetry monitoring and  intermittent blood pressure might.  The right groin was prepped and  draped in a sterile fashion.  Xylocaine 1% was used for local  anesthesia.  Using a modified Seldinger technique, a 5-French sheath was  placed in right femoral artery.  Under fluoroscopic guidance, a 5-French  JR-4 catheter was placed in the area of the right coronary artery, but  could not cannulate the artery.  The catheter was exchanged over a  guidewire for a 5-French no-torque  right coronary artery catheter, but  again could not engage the coronary ostium.  The catheter was removed  over a guide wire.  A 5-French AL-1 catheter was attempted at engaging  the right coronary ostium under fluoroscopy, but was unsuccessful as  well.  This catheter was exchanged out over a guidewire.  The 5-French  sheath was then exchanged over a  guidewire and upgraded to a 6-French  sheath.  A 6-French JR-4 catheter was then attempted, but could not  cannulate the right coronary ostium.  My colleague Dr. Amil Amen stepped  in at that time and was able to cannulate the right coronary ostium with  an Amplatz catheter.  This revealed a 90% stenosis of the distal RCA.  The patient subsequently went on to PCI of the distal RCA.   RESULTS:  The right coronary artery is heavily calcified throughout its  course distally.  In the distal RCA, there is a 90% lesion before it  bifurcates in the posterior descending artery and posterior lateral  artery.   ASSESSMENT:  Two-vessel obstructive coronary artery disease with an RCA  distal of 80% and a distal left circumflex 80%, too small for  percutaneous coronary intervention.   PLAN:  Percutaneous coronary intervention of the distal right coronary  artery per Dr. Amil Amen.  Start Plavix and continue aspirin.      Armanda Magic, M.D.  Electronically Signed     TT/MEDQ  D:  01/24/2008  T:  01/25/2008  Job:  [id number]   cc:   Gretta Arab. Valentina Lucks, M.D.

## 2010-08-23 NOTE — H&P (Signed)
NAMETAMU, GOLZ NO.:  [id number]   MEDICAL RECORD NO.:  [id number]          PATIENT TYPE:  INP   LOCATION:  6735                         FACILITY:  [facility]   PHYSICIAN:  Ramiro Harvest, MD    DATE OF BIRTH:  [birth year]   DATE OF ADMISSION:  06/09/2008  DATE OF DISCHARGE:                              HISTORY & PHYSICAL   PRIMARY CARE PHYSICIAN:  Dr. Maurice Murillo of [facility].   CARDIOLOGIST:  Dr. Armanda Magic of [facility].   CHIEF COMPLAINT:  Syncope.   HISTORY OF PRESENT ILLNESS:  Lisa Murillo is a pleasant 70 year old  female with history of coronary artery disease, status post PTCI x2,  last one in October 2009, with a drug-eluting stent to the distal RCA,  also a history of hypertension, hyperlipidemia and vertigo, who  presented to the ED with a syncopal episode.  The patient and husband  had been busy all day getting ready for a trip to [place] on the day of  admission.  The patient stated that later on in the afternoon 1-day  prior to admission she felt a little feverish and cold, laid down in the  bed for a few hours.  The patient got up two hours later and went to the  kitchen.  In the kitchen, she felt dizzy and then blacked out.  The  patient does not remember anything else beyond that.  Per husband, he  heard the crash and found the patient on the floor who was initially  unresponsive.  The husband tried to wake her up and could not and called  911, and the patient slowly regained consciousness and was unconscious  for a total of approximately a minute.  The patient endorses some nausea  and emesis en route to the ED, chronic constipation, some chills,  subjective fevers and generalized fatigue.  The patient denies any chest  pain.  No shortness of breath, no palpitations, no diarrhea, no  abdominal pain, no dysuria, no focal neurological symptoms.  No seizure  activity noted.  No recent travel.  The patient in the ED had a CT  of  the head which was negative.  Point of care cardiac markers were  negative x2.  EKG was normal sinus rhythm.  CBC with a hemoglobin of  10.5, otherwise was within normal limits.  I-Stat 8 with a BUN of 35,  creatinine of 1.7.  We were called to admit the patient for further  evaluation and management.   ALLERGIES:  SIMVASTATIN CAUSES A RASH.   PAST MEDICAL HISTORY:  1. Coronary artery disease, status post PTCA to the distal RCA with a      drug-eluding stent on January 22, 2008.  2. Coronary artery disease, status post inferior MI with PTCA in 1995.  3. Hypertension.  4. Hyperlipidemia.  5. Gastroesophageal reflux disease.  6. Vertigo.  7. Osteoporosis.  8. Chronic fatigue.  9. Mild mitral valvular regurgitation.  10.Mild aortic valve sclerosis with murmur.  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 Luciana Axe.  12.Status post shoulder repair x2.   HOME MEDICATIONS:  1. Plavix 75 mg p.o. daily  2. Enteric-coated aspirin 325 mg p.o. daily.  3. Sublingual nitroglycerin p.r.n. chest pain.  4. Crestor 20 mg p.o. daily.  5. Norvasc 10 mg p.o. daily.  6. Iron 65 mg b.i.d.  7. Zetia 10 mg p.o. daily  8. Benicar 20 mg p.o. daily.  9. Hydrochlorothiazide 25 mg p.o. daily.  10.Metoprolol 50 mg p.o. q.a.m. and 25 mg p.o. nightly.   SOCIAL HISTORY:  The patient is a housewife.  No tobacco use.  No  alcohol use.  No IV drug use.  The patient has been living in [place]  half the year and in [place] the other half of the year approximately.   FAMILY HISTORY:  Noncontributory.   REVIEW OF SYSTEMS:  As per HPI, otherwise negative.   PHYSICAL EXAMINATION:  VITAL SIGNS:  Temperature 98.1, blood pressure  125/47, pulse of 60, respiratory rate 20.  Saturating 98% on 2 liters  nasal cannula.  GENERAL:  Patient lying in bed in no apparent distress.  HEENT:  Normocephalic, atraumatic.  Pupils equal, round and reactive to  light and  accommodation.  Extraocular movements intact.  Oropharynx  clear.  No lesions, no exudates.  NECK:  Supple.  No lymphadenopathy.  Dry mucous membranes.  RESPIRATORY:  Lungs are clear to auscultation bilaterally.  No wheezes,  no crackles, no rhonchi.  CARDIOVASCULAR:  Regular rate and rhythm with  a grade 3/6 systolic ejection murmur at the right upper sternal border.  ABDOMEN:  Soft, nontender, nondistended.  Positive bowel sounds.  EXTREMITIES:  No clubbing, cyanosis or edema.  NEUROLOGICAL:  The patient is alert and oriented x3.  Cranial nerves II-  XII are grossly intact.  No focal deficits.   ADMISSION LABORATORY DATA:  CBC - white count 7.2, hemoglobin 10.5,  hematocrit 30.2, MCV of 88.5, platelet count of 209, ANC of 5.6.  I-Stat  8; sodium 140, potassium 4.1, chloride 110, glucose 123, BUN 35,  creatinine 1.7.  Point of care cardiac markers; CK-MB 5.6, troponin-I  less than 0.05, myoglobin of 205.  Next set of point of care cardiac  markers; CK-MB 5.1, troponin-I less than 0.05, myoglobin of 214.  UA is  yellow clear, specific gravity 1.018, pH of 6.5, glucose negative,  bilirubin negative, ketones negative, blood negative, protein negative,  urobilinogen 0.2, nitrite negative, leukocytes negative.  A chest x-ray  obtained showed no acute finding and a low volume chest.  CT of the head  without contrast showed no acute intracranial abnormality, unchanged  mild atrophy, right maxillary sinus disease.  EKG with normal sinus  rhythm.  No change from prior EKGs.   ASSESSMENT AND PLAN:  [name] is a 70 year old female  history of coronary artery disease, status post percutaneous coronary  intervention at x2, history of hypertension, hyperlipidemia, prior  history of vertigo, who presents to the emergency department with a  syncopal episode.   1. Syncope.  Differential includes cardiogenic versus neurocardiogenic      versus orthostatic versus neurological.  Head CT is  negative.  No      focal neurological findings.  No evidence of any seizure activity.      We will admit the patient to telemetry, cycle cardiac enzymes q.8      h., x3, check a 2-D echo to rule out aortic stenosis versus left      ventricular dysfunction.  Check a magnesium level.  Check  a      comprehensive metabolic profile.  Check coags.  Check a fasting      lipid panel.  Check orthostatics.  Hold diuretics for now, IV      fluids and follow.  2. Dehydration.  IV fluids.  3. Coronary artery disease, status post percutaneous coronary      intervention at x2, stable.  Cardiac enzymes q.8 h., x3.  Check a 2-      D echo.  Continue Crestor, Plavix, aspirin and low-dose beta-      blocker and follow.  4. Hypertension.  Hold blood pressure medications.  5. Hyperlipidemia.  Check a fasting lipid panel.  Continue home dose      Crestor and Zetia.  6. Gastroesophageal reflux disease.  Protonix.  7. Vertigo.  8. History of mild mitral regurgitation.  9. Mild aortic valve sclerosis.  10.Acute renal insufficiency, baseline creatinine of 1.11 in October      2009, likely prerenal azotemia secondary to dehydration in the      setting of diuretic use versus renal versus post renal.  Check a      fractional excretion of sodium, hold diuretics and Benicar for now.      Hydrate with IV fluids gently.  If no improvement in her renal      function, we will check a renal ultrasound to rule out      hydronephrosis.  11.Anemia.  Check an anemia panel.  Continue home dose iron.  12.Prophylaxis.  Protonix for gastrointestinal prophylaxis.      Sequential compression devices for deep venous thrombosis      prophylaxis.   It has been a pleasure taking care of [name].      Ramiro Harvest, MD  Electronically Signed     DT/MEDQ  D:  06/09/2008  T:  06/09/2008  Job:  [id number]   cc:   Gretta Arab. Valentina Lucks, M.D.  Armanda Magic, M.D.

## 2010-08-23 NOTE — Cardiovascular Report (Signed)
Lisa Murillo, Lisa Murillo            ACCOUNT NO.:  [id number]   MEDICAL RECORD NO.:  [id number]          PATIENT TYPE:  INP   LOCATION:  6527                         FACILITY:  [facility]   PHYSICIAN:  Armanda Magic, M.D.     DATE OF BIRTH:  [birth year]   DATE OF PROCEDURE:  01/22/2008  DATE OF DISCHARGE:  01/24/2008                            CARDIAC CATHETERIZATION   PROCEDURES:  1. Left cardiac catheterization.  2. Coronary angiography.   INDICATIONS:  Chest pain.   COMPLICATIONS:  None.   IV access via right femoral artery, 4-French sheath.   This is a 70 year old female who has a history of an inferior myocardial  infarction dating back to 1995, at which time she had a PCI of the RCA.  She now presents with chest pain and a reversible defect in the inferior  wall on nuclear myocardial perfusion.  She now presents for cardiac  catheterization.   The patient was brought to the Cardiac Catheterization Laboratory in the  fasting, nonsedated state.  Informed consent was obtained.  The patient  was connected to continuous heart rate and pulse oximetry monitoring and  intermittent blood pressure monitoring.  The right groin was prepped and  draped in sterile fashion.  Xylocaine 1% was used for local anesthesia.  Using modified Seldinger technique, a 4-French sheath was placed in the  right femoral artery.  Under fluoroscopic guidance, a 4-French JL-4  catheter was placed in the left coronary artery.  Multiple cine films  were taken at 30-degree RAO and 40-degree LAO views.  This catheter was  then exchanged out over a guidewire for a 4-French 3-D RCA catheter,  which could not engage the coronary ostium under fluoroscopy.  The  catheter was exchanged out over a guidewire for a 4-French JR-4  catheter, which again could not successfully engage the coronary ostium.  The catheter was removed over a guide wire and under fluoroscopic  guidance, a 4-French angled pigtail catheter was placed in  left  ventricular cavity.  Left ventricular pressure was measured.  Catheter  was then pulled back across the aortic valve with no significant  gradient noted.  Left ventriculography was not performed because of the  patient's underlying renal insufficiency.   The pigtail catheter was then exchanged out over a guidewire for a 4-  [place] AL-1 catheter, which again could not successfully engaged the  coronary ostium.  My colleague, Dr. Lance Muss then stepped in  and tried multiple catheters including a 4-French LCB catheter and a 4-  French Amplatz catheter.  None of which were successful engaging the  coronary ostium.  At the end of procedure, all catheters and sheaths  were removed.  Manual compression was performed until adequate  hemostasis was obtained.  This patient was transferred back into the  room in stable condition.   RESULTS:  Left main coronary artery is widely patent, but calcified and  bifurcates in the left anterior descending artery and left circumflex  artery.   Left anterior descending artery is calcified in its proximal portion,  but is widely patent throughout its course of the apex.  The left circumflex is widely patent in its proximal and midportions.  It gives rise to a first obtuse marginal branch, which is widely patent  and bifurcates into 2 daughter branches.  It then gives rise to a second  obtuse marginal branch, again which is patent.  Just at the takeoff of  the second obtuse marginal branch, there is an 80% narrowing in the  distal left circumflex, which is a very small vessel not amenable to  PCI.   Left ventricular pressure 135/50 mmHg, LVEDP 12 mmHg, aortic pressure  134/62 mmHg.  Of note, the right coronary artery was heavily calcified,  but again unable to cannulate to visualize the anatomy.  The patient did  have chest pain during the study, which was relieved with one sublingual  nitro spray.   ASSESSMENT:  1. One-vessel  obstructive coronary artery disease of a small distal      left circumflex not amenable to percutaneous coronary intervention.      Unable to cannulate the right coronary artery.  2. Normal left ventricular function by nuclear myocardial perfusion      images.  3. No evidence of gradient across the aortic valve.  4. Chest pain.   PLAN:  We will admit to telemetry bed and give IV fluids overnight for  hydration and then plan repeat cardiac catheterization tomorrow to try  to further evaluate the right coronary artery upgrading to a 6-French  sheath.      Armanda Magic, M.D.  Electronically Signed     TT/MEDQ  D:  01/24/2008  T:  01/25/2008  Job:  [id number]   cc:   Gretta Arab. Valentina Lucks, M.D.

## 2010-08-26 NOTE — Op Note (Signed)
NAMETEVIS, CONGER                        ACCOUNT NO.:  [id number]   MEDICAL RECORD NO.:  [id number]                   PATIENT TYPE:  AMB   LOCATION:  ENDO                                 FACILITY[facility]   PHYSICIAN:  Danise Edge, M.D.                DATE OF BIRTH:  1940/08/04   DATE OF PROCEDURE:  12/10/2003  DATE OF DISCHARGE:                                 OPERATIVE REPORT   PROCEDURE:  Colonoscopy.   INDICATIONS FOR PROCEDURE:  [name] is a 70 year old female  born Apr 06, 1941.  To evaluate right lower quadrant abdominal pain, Ms.  Lisa Murillo underwent a CT scan of the abdomen and pelvis on November 18, 2001.  CT scan of the abdomen was normal.  CT scan of the pelvis revealed a  possible  mass in the base of her cecum.  The distal ileum and appendix  appeared normal.  [name] tells me that her right lower quadrant  abdominal discomfort has essentially resolved.   ENDOSCOPIST:  Charolett Bumpers, M.D.   PREMEDICATION:  Demerol 30 mg, Versed 5 mg.   PROCEDURE:  After obtaining confirmed consent, [name] was placed in the  left lateral decubitus position.  I administered intravenous Demerol and  intravenous Versed to achieve conscious sedation for the procedure.  The  patient's blood pressure, oxygen saturation, and cardiac rhythm were  monitored throughout the procedure and documented in the medical record.  Anal inspection and digital rectal exam were normal.  The Olympus adjustable  pediatric video colonoscope was introduced into the rectum and advanced to  the cecum.  A normal appearing ileocecal valve was intubated and the distal  ileum inspected.  Colonic preparation for the exam today was excellent.   Rectum normal.  Sigmoid colon and descending colon normal.  Splenic flexure normal.  Transverse colon normal.  Hepatic flexure normal.  Ascending colon normal.  Cecum and ileocecal valve normal.  Distal ileum normal.   ASSESSMENT:  Normal  proctocolonoscopy to the cecum with intubation of a  normal appearing ileocecal valve and inspection of the distal ileum.  Photographs were made of the cecal base and the normal ileocecal valve.                                               Danise Edge, M.D.    MJ/MEDQ  D:  12/10/2003  T:  12/10/2003  Job:  [id number]   cc:   Sharlet Salina, M.D.  [address]  [place]  [place] [address]  Fax: [phone]

## 2010-08-26 NOTE — Discharge Summary (Signed)
Lisa Murillo, Lisa Murillo            ACCOUNT NO.:  [id number]   MEDICAL RECORD NO.:  [id number]          PATIENT TYPE:  INP   LOCATION:  6527                         FACILITY:  [facility]   PHYSICIAN:  Armanda Magic, M.D.     DATE OF BIRTH:  [birth year]   DATE OF ADMISSION:  01/22/2008  DATE OF DISCHARGE:  01/24/2008                               DISCHARGE SUMMARY   DISCHARGE DIAGNOSES:  1. Coronary artery disease, status post percutaneous intervention of      the distal right coronary artery utilizing an Endeavor stent, drug      eluting.  2. Hyperlipidemia.  3. Hypertension.  4. Long-term medication use.   HOSPITAL COURSE:  Lisa Murillo is a 70 year old female, who has known  coronary artery disease.  She had been having chest pain and underwent a  Cardiolite study that showed reversible defect in the inferior wall.  She was admitted on January 22, 2008, for cardiac catheterization.   Dr. Mayford Knife took the patient to the cath lab and was able to investigate  the left coronary artery system.  There was one-vessel obstructive  coronary artery disease of a small distal left circumflex unamenable to  percutaneous intervention.  She was unable to cannulate the right  coronary artery.  At this time, the patient was kept in the hospital  overnight.  The next day, she was brought back to the catheterization  lab and Dr. Amil Amen performed right coronary angiography.  The patient  was found to have a 90% distal right coronary artery lesion.   Because of this lesion, it was felt necessary that the patient needed  percutaneous intervention and a drug-eluting stent was implanted.  The  patient remained in the hospital overnight and was ready for discharge  to home the following day.   LAB STUDIES:  Sodium 138, potassium 4.1, BUN 12, creatinine 1.1, and  platelets 213.   DISCHARGE MEDICATIONS:  1. Plavix 75 mg a day.  2. Enteric-coated aspirin 325 mg a day.  3. Sublingual nitroglycerin p.r.n.  chest pain.  4. Crestor 20 mg a day.  5. Norvasc 10 mg a day.  6. Iron twice a day.  7. Zetia 10 mg a day.  8. Benicar 20 mg a day.  9. Hydrochlorothiazide 25 mg a day.  10.Metoprolol 25 mg twice a day.   Stop Nexium.   The patient is to clean cath site gently with soap and water.  No  scrubbing.  Remain on a low-sodium, heart-healthy diet.  No driving for  2 days.  No lifting over 10 pounds for 1 week.  Follow up with Dr.  Mayford Knife on February 06, 2008, at 10:00 a.m.      Guy Franco, P.A.      Armanda Magic, M.D.  Electronically Signed    LB/MEDQ  D:  01/31/2008  T:  02/01/2008  Job:  [id number]

## 2010-08-26 NOTE — Op Note (Signed)
Lisa Murillo, Lisa Murillo            ACCOUNT NO.:  [id number]   MEDICAL RECORD NO.:  [id number]          PATIENT TYPE:  INP   LOCATION:  2550                         FACILITY:  [facility]   PHYSICIAN:  Alford Highland. Rankin, M.D.   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   POSTOPERATIVE DIAGNOSIS:  Epiretinal membrane with significant distortion  and visual acuity impairment in the left eye.   PROCEDURE:  Posterior vitrectomy and membrane peel, left eye, 25 gauge.   SURGEON:  Alford Highland. Rankin, M.D.   ANESTHESIA:  Local retrobulbar and monitored anesthesia care.   INDICATIONS FOR PROCEDURE:  The patient is a 70 year old woman who has  profound vision loss on the basis of epiretinal membrane with macular  topographic distortion.  The patient understands this is an attempt to  release macular topographic distortion so as to allow for enhanced visual  acuity and acuity improvement.  She understands the risks of anesthesia  including the rare occurrence of death as well as to the eye including but  not limited to hemorrhage, infection, scarring, need for further surgery, no  change in vision, loss of vision, progression of disease despite  intervention.  After appropriate signed consent was obtained, the patient  was taken to the operating room.   DESCRIPTION OF PROCEDURE:  In the operating room, appropriate monitoring was  followed by mild sedation.  0.75% Marcaine was delivered 5 mL retrobulbar  and an additional 5 mL laterally in the fashion of modified Darel Hong.  The  left periocular region was sterilely prepped and draped in the usual  ophthalmic fashion.  A lid speculum was applied.  The microscope was placed  into position.  A 25 gauge trocar system was used to engage the infusion.  Placement in the vitreous cavity was verified visually.  A superior trocar  was placed.  A core vitrectomy was then begun.  The vitreous skirt trimmed  and circumscribed 360 degrees.  No complications occurred.  Membrane forceps  were then used to engage the internal limiting membrane, this was removed as  a continuous single sheet off the entire macular region without difficulty.  No complications occurred.  The peripheral retina was inspected and found to  be free of retinal holes and tears.  Further vitrectomy was carried out at  the sites of the trocar placements superiorly so as to minimize vitreous  incarceration.  Under low infusion pressures, the trocars were removed.  Under the low infusion pressure, the infusion was removed.  Subconjunctival  Decadron was applied.  A sterile patch and Fox shield applied to the left  eye.  The patient tolerated the procedure well without complications.     Alford Highland Rankin, M.D.  Electronically Signed    GAR/MEDQ  D:  11/16/2004  T:  11/16/2004  Job:  [id number]

## 2010-09-09 ENCOUNTER — Ambulatory Visit (INDEPENDENT_AMBULATORY_CARE_PROVIDER_SITE_OTHER): Payer: Medicare Other | Admitting: Family Medicine

## 2010-09-09 DIAGNOSIS — M810 Age-related osteoporosis without current pathological fracture: Secondary | ICD-10-CM

## 2010-09-09 DIAGNOSIS — R42 Dizziness and giddiness: Secondary | ICD-10-CM

## 2010-09-09 DIAGNOSIS — K219 Gastro-esophageal reflux disease without esophagitis: Secondary | ICD-10-CM | POA: Insufficient documentation

## 2010-09-09 LAB — CBC WITH DIFFERENTIAL/PLATELET
Eosinophils Absolute: 0.1 10*3/uL (ref 0.0–0.7)
Eosinophils Relative: 1 % (ref 0–5)
Lymphs Abs: 1.4 10*3/uL (ref 0.7–4.0)
MCH: 29.6 pg (ref 26.0–34.0)
MCV: 90.5 fL (ref 78.0–100.0)
Monocytes Relative: 9 % (ref 3–12)
Platelets: 251 10*3/uL (ref 150–400)
RBC: 3.89 MIL/uL (ref 3.87–5.11)

## 2010-09-09 NOTE — Assessment & Plan Note (Signed)
May be related to visual field input changes due to retinitis.  No abnormalities on exam today.  Check electrolytes and blood count.  Reviewed supportive care and red flags that should prompt return.  Pt expressed understanding and is in agreement w/ plan.

## 2010-09-09 NOTE — Progress Notes (Signed)
  Subjective:    Patient ID: Lisa Murillo, female    DOB: 04-Jun-1940, 70 y.o.   MRN: [id number]  HPI Since reclast infusion has been having bone aches, dizziness, fatigue, GERD.  Started 2 days after infusion.  The bone ache and fatigue has improved.  Still having dizziness.  Has recently increased Crestor to 20mg  nightly.  GERD- pt is not sure whether she is still taking protonix or not.  Reports sxs are worse w/ 'certain foods'.  Has been more noticeable since Reclast infusion.  Eye infxn- pt apparently is currently being treated for a retinitis by ophtho.  Has been on prescription drops x3 and she reports the side effects are bothersome- dry mouth.  Isn't sure whether her eye infection is contributing to her dizziness.   Review of Systems For ROS see HPI     Objective:   Physical Exam  Constitutional: She is oriented to person, place, and time. She appears well-developed and well-nourished. No distress.  HENT:  Head: Normocephalic and atraumatic.  Eyes: Conjunctivae and EOM are normal. Pupils are equal, round, and reactive to light.  Neck: Normal range of motion. Neck supple. No thyromegaly present.  Cardiovascular: Normal rate, regular rhythm and intact distal pulses.   Murmur (II/VI SEM) heard. Pulmonary/Chest: Effort normal and breath sounds normal. No respiratory distress.  Abdominal: Soft. She exhibits no distension. There is no tenderness.  Musculoskeletal: She exhibits no edema.  Lymphadenopathy:    She has no cervical adenopathy.  Neurological: She is alert and oriented to person, place, and time.  Skin: Skin is warm and dry.  Psychiatric: She has a normal mood and affect. Her behavior is normal.          Assessment & Plan:

## 2010-09-09 NOTE — Progress Notes (Signed)
Addended by: Candie Echevaria L on: 09/09/2010 05:27 PM   Modules accepted: Orders

## 2010-09-09 NOTE — Patient Instructions (Signed)
Please call me and let me know if you are still taking the Prontonix for your heartburn Take the Nexium samples for your heartburn (but do not take both the Protonix and Nexium) We'll notify you of your lab results Increase your fluid intake Tylenol as needed for pain Call with any questions or concerns Hang in there!

## 2010-09-09 NOTE — Progress Notes (Signed)
Addended byDuaine Dredge, Abdi Husak L on: 09/09/2010 03:00 PM   Modules accepted: Orders

## 2010-09-09 NOTE — Progress Notes (Signed)
Addended by: Candie Echevaria L on: 09/09/2010 03:03 PM   Modules accepted: Orders

## 2010-09-09 NOTE — Assessment & Plan Note (Signed)
Pt had flu like sxs after Reclast infusion.  Will need to determine next year whether pt would like repeat infusion.  Husband is in favor of it, pt isn't sure.  Will follow.

## 2010-09-09 NOTE — Assessment & Plan Note (Signed)
Samples of Nexium given while pt checks on whether she is still on Protonix.  Reviewed lifestyle modifications and trigger avoidance.  Will follow.

## 2010-09-12 ENCOUNTER — Telehealth: Payer: Self-pay | Admitting: *Deleted

## 2010-09-12 ENCOUNTER — Encounter: Payer: Self-pay | Admitting: *Deleted

## 2010-09-12 LAB — BASIC METABOLIC PANEL
BUN: 22 mg/dL (ref 6–23)
Creatinine, Ser: 1.5 mg/dL — ABNORMAL HIGH (ref 0.4–1.2)
GFR: 37.65 mL/min — ABNORMAL LOW (ref >60.00)
Glucose, Bld: 100 mg/dL — ABNORMAL HIGH (ref 70–99)
Potassium: 5.3 mEq/L — ABNORMAL HIGH (ref 3.5–5.1)

## 2010-09-12 LAB — HEPATIC FUNCTION PANEL
ALT: 17 U/L (ref 0–35)
Alkaline Phosphatase: 72 U/L (ref 39–117)
Bilirubin, Direct: 0.1 mg/dL (ref 0.0–0.3)
Total Bilirubin: 0.6 mg/dL (ref 0.3–1.2)

## 2010-09-12 NOTE — Telephone Encounter (Signed)
Left message to call office

## 2010-09-12 NOTE — Progress Notes (Signed)
Addended by: Candie Echevaria L on: 09/12/2010 10:11 AM   Modules accepted: Orders

## 2010-09-12 NOTE — Telephone Encounter (Signed)
Message copied by Verdene Rio on Mon Sep 12, 2010 10:56 AM ------      Message from: Sheliah Hatch      Created: Mon Sep 12, 2010 10:35 AM       If she continues to have severe heartburn she should follow up with her GI doctor b/c she has a history of ulcers and GI bleed.      ----- Message -----         From: Candie Echevaria, CMA         Sent: 09/12/2010  10:11 AM           To: Neena Rhymes, MD            Pt wanted to let you know that she checked her med and she is taking protonix

## 2010-09-12 NOTE — Telephone Encounter (Signed)
Discuss with patient  

## 2010-09-16 ENCOUNTER — Other Ambulatory Visit: Payer: Medicare Other

## 2010-09-19 ENCOUNTER — Other Ambulatory Visit (INDEPENDENT_AMBULATORY_CARE_PROVIDER_SITE_OTHER): Payer: Medicare Other

## 2010-09-19 DIAGNOSIS — N289 Disorder of kidney and ureter, unspecified: Secondary | ICD-10-CM

## 2010-09-19 LAB — BASIC METABOLIC PANEL
CO2: 21 mEq/L (ref 19–32)
Calcium: 8.6 mg/dL (ref 8.4–10.5)
Creatinine, Ser: 1.9 mg/dL — ABNORMAL HIGH (ref 0.4–1.2)
Glucose, Bld: 121 mg/dL — ABNORMAL HIGH (ref 70–99)

## 2010-09-19 NOTE — Progress Notes (Signed)
Addended by: Lucious Groves I on: 09/19/2010 04:33 PM   Modules accepted: Orders

## 2010-09-19 NOTE — Progress Notes (Signed)
Labs only

## 2010-10-17 ENCOUNTER — Encounter: Payer: Self-pay | Admitting: Internal Medicine

## 2010-10-18 ENCOUNTER — Other Ambulatory Visit (INDEPENDENT_AMBULATORY_CARE_PROVIDER_SITE_OTHER): Payer: Medicare Other | Admitting: *Deleted

## 2010-10-18 DIAGNOSIS — E785 Hyperlipidemia, unspecified: Secondary | ICD-10-CM

## 2010-10-18 LAB — LIPID PANEL
Cholesterol: 195 mg/dL (ref 0–200)
LDL Cholesterol: 124 mg/dL — ABNORMAL HIGH (ref 0–99)
Total CHOL/HDL Ratio: 4

## 2010-10-24 ENCOUNTER — Other Ambulatory Visit: Payer: Self-pay | Admitting: *Deleted

## 2010-10-24 DIAGNOSIS — M2550 Pain in unspecified joint: Secondary | ICD-10-CM

## 2010-11-15 ENCOUNTER — Other Ambulatory Visit (INDEPENDENT_AMBULATORY_CARE_PROVIDER_SITE_OTHER): Payer: Medicare Other | Admitting: *Deleted

## 2010-11-15 DIAGNOSIS — M2550 Pain in unspecified joint: Secondary | ICD-10-CM

## 2010-11-24 ENCOUNTER — Other Ambulatory Visit: Payer: Self-pay | Admitting: *Deleted

## 2010-11-24 DIAGNOSIS — E782 Mixed hyperlipidemia: Secondary | ICD-10-CM

## 2010-11-24 MED ORDER — EZETIMIBE 10 MG PO TABS: 10.0000 mg | ORAL_TABLET | Freq: Every day | ORAL | Status: DC

## 2010-11-24 MED ORDER — ROSUVASTATIN CALCIUM 5 MG PO TABS: 5.0000 mg | ORAL_TABLET | Freq: Every day | ORAL | Status: DC

## 2010-12-19 IMAGING — CT CT PELVIS W/ CM
2 of 5 series · 14 of 32 positions shown, 19 images · IV contrast (30CC OMNI 350 & [ID] OMNI 300)
Comparison: Lung bases are clear.

CLINICAL DATA: Abdominal pain, weight loss, constipation loss of
appetite

CT ABDOMEN AND PELVIS WITH CONTRAST
TECHNIQUE: Multidetector CT imaging of the abdomen and pelvis was
performed using the standard protocol following bolus
administration of intravenous contrast.
Contrast: 100 ml Omnipaque 300

[Series 2: abdomen w/ · axial · 0.70mm/px · z∈[-263,+27]mm · 6 of 82 slices shown, 11 images]
[im 12/82  soft-tissue]
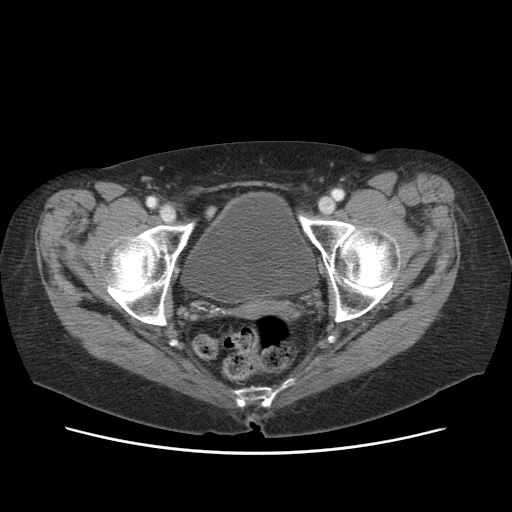
[im 12/82  bone]
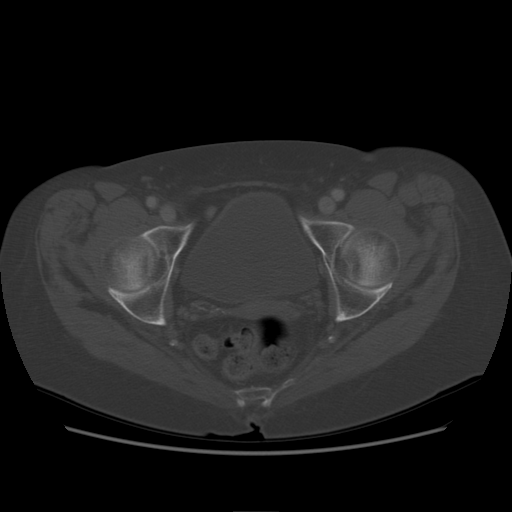
[im 24/82  soft-tissue]
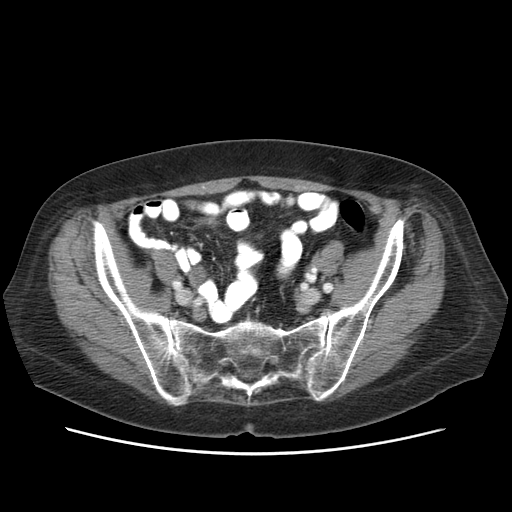
[im 35/82  soft-tissue]
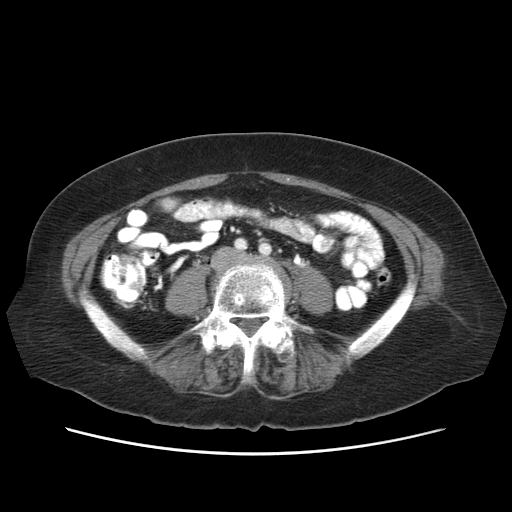
[im 35/82  lung]
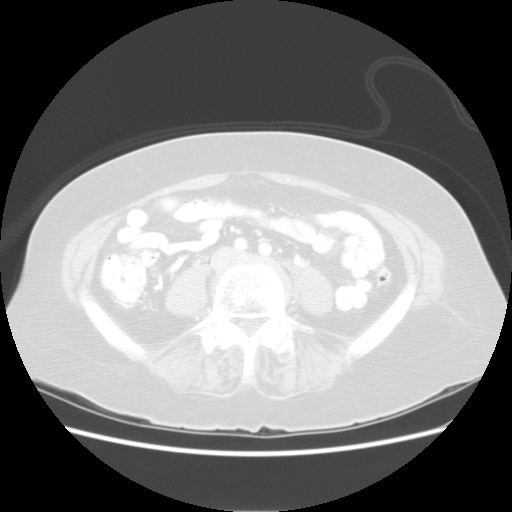
[im 47/82  soft-tissue]
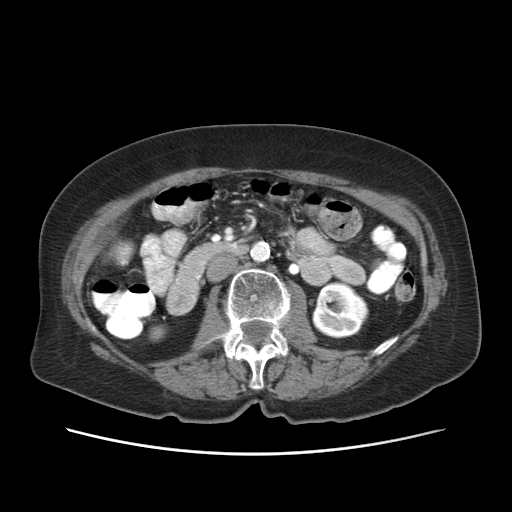
[im 47/82  lung]
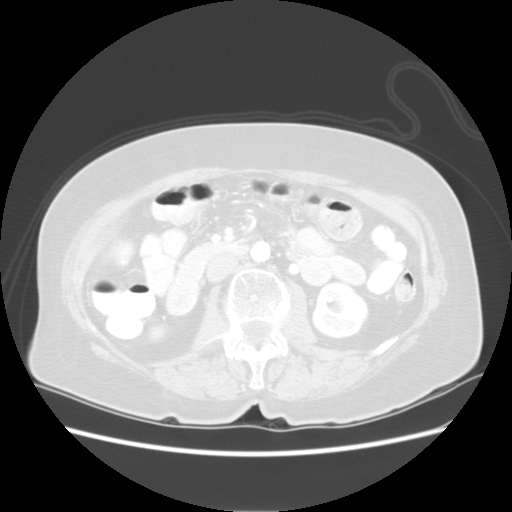
[im 58/82  soft-tissue]
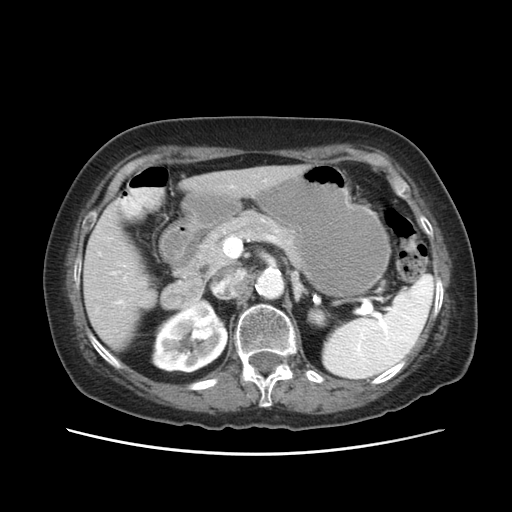
[im 58/82  lung]
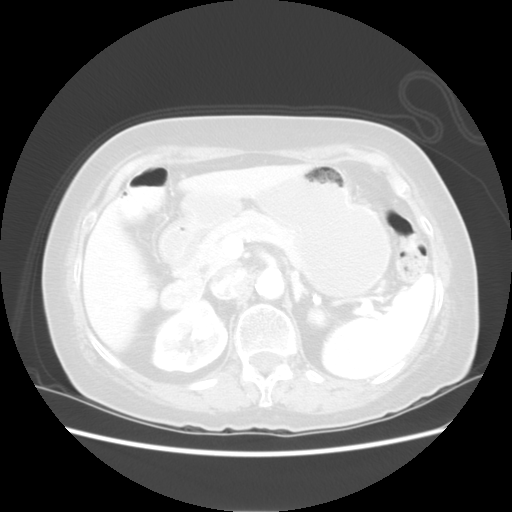
[im 70/82  soft-tissue]
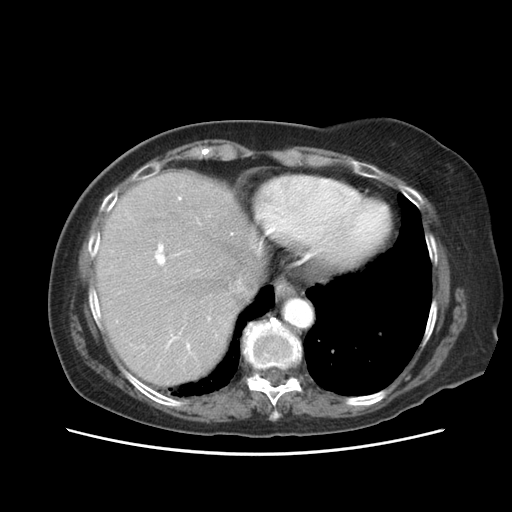
[im 70/82  lung]
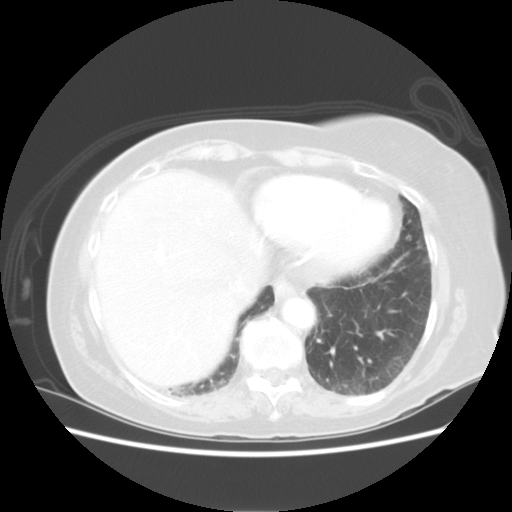

[Series 401: sagittal · sagittal · 0.84mm/px · 8 of 136 slices shown]
[im 13/136  soft-tissue]
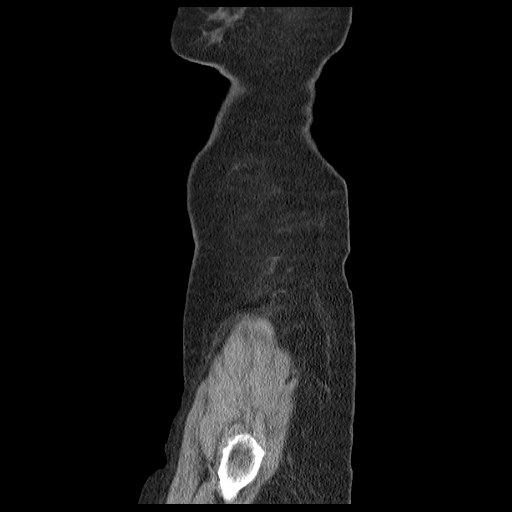
[im 25/136  soft-tissue]
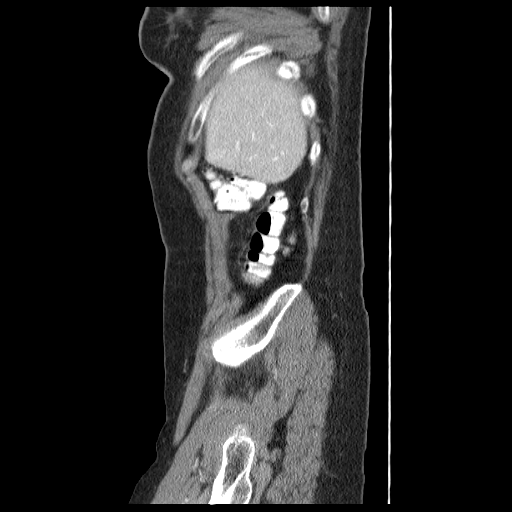
[im 50/136  soft-tissue]
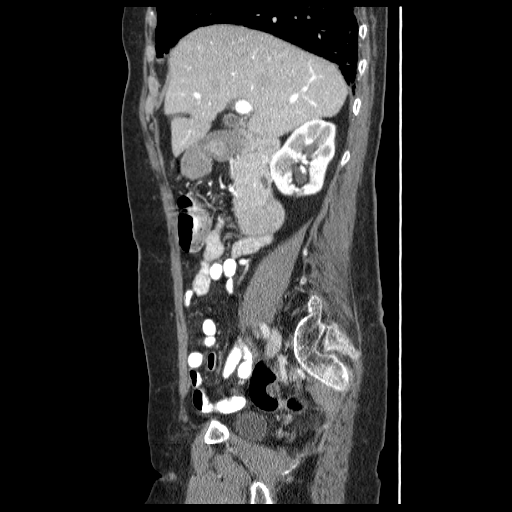
[im 62/136  soft-tissue]
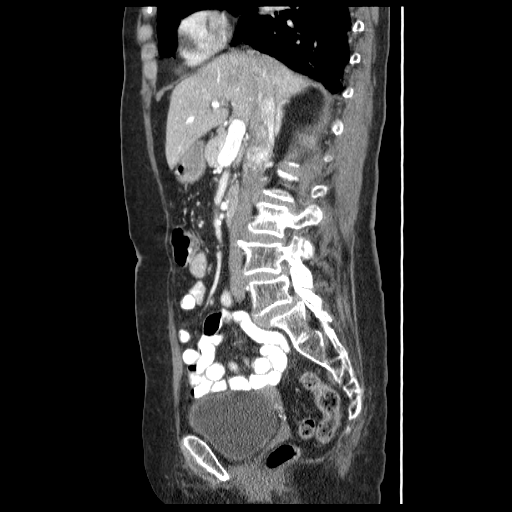
[im 74/136  soft-tissue]
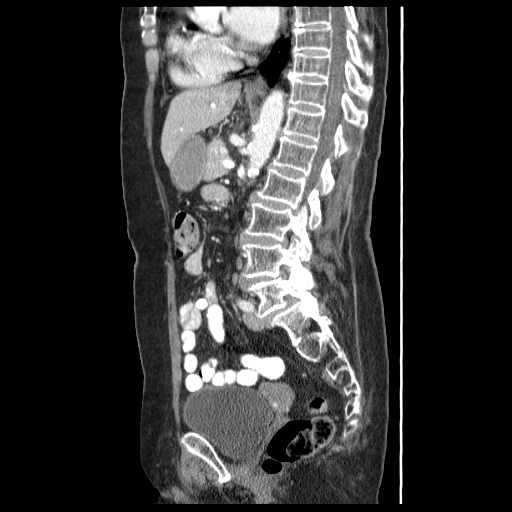
[im 86/136  soft-tissue]
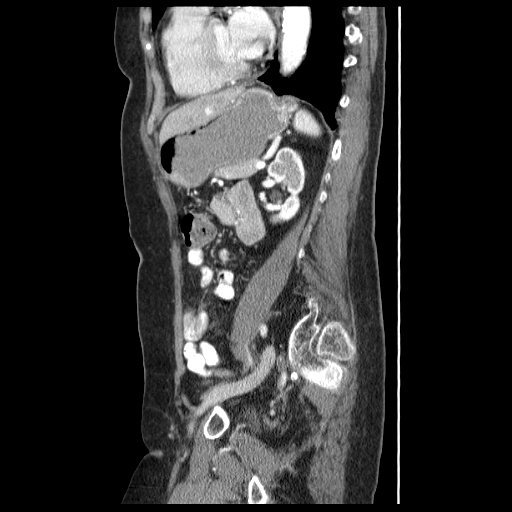
[im 111/136  soft-tissue]
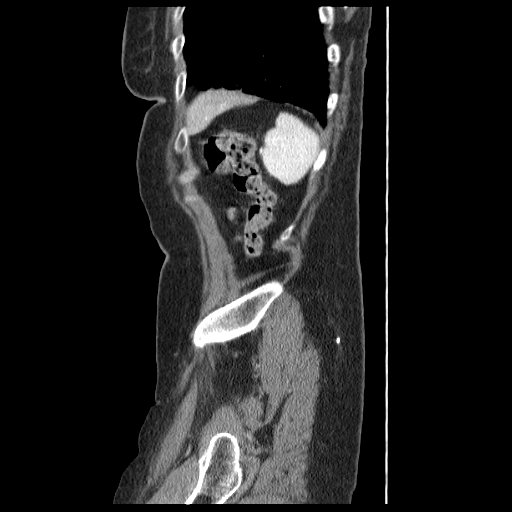
[im 123/136  soft-tissue]
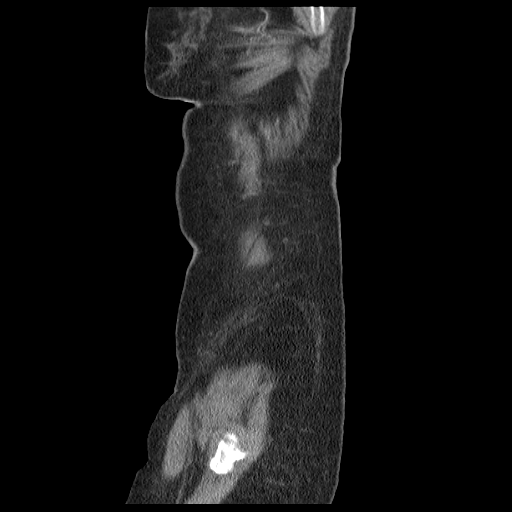

[14 of 32 positions shown; findings below may reference images not displayed]

No focal hepatic lesion.  The
gallbladder, pancreas, spleen, adrenal glands, and kidneys appear
normal.  There is a low density cyst within the lower pole of the
right kidney which is too small to characterize.

Stomach appears normal.  There is a circumferential bowel wall
edema involving the first portion the duodenum.  The second and
third portion the duodenum appears normal.  The small bowel appears
normal.  The colon appears normal.

Abdominal aorta is normal caliber.  No evidence of retroperitoneal
or periportal lymphadenopathy.

CT ABDOMEN
FINDINGS: 
IMPRESSION: 1.    Bowel wall edema of the first portion of the duodenum.
      Differential includes infectious or inflammatory duodenitis, peptic
      ulcer disease, or inflammatory bowel disease. Endoscopy may aid in
      differential.

2.    No additional acute findings.

CT PELVIS
FINDINGS: No free fluid the pelvis.  The bladder appears normal.
The uterus and adnexa appear normal.  The rectum and sigmoid colon
appear normal.

No evidence of pelvic lymphadenopathy. Review of  bone windows
demonstrates no aggressive osseous lesions. There is a grade 1
anterolisthesis of L5 on S1.
IMPRESSION: 1. No  acute pelvic process.

2.  Anterolisthesis of L5 on S1.

## 2010-12-22 ENCOUNTER — Other Ambulatory Visit: Payer: Self-pay | Admitting: Family Medicine

## 2010-12-22 ENCOUNTER — Other Ambulatory Visit: Payer: Self-pay | Admitting: Internal Medicine

## 2010-12-22 NOTE — Telephone Encounter (Signed)
Pt advise that labs were due January 2012 to recheck her levels. Pt states that she was just inquiring because she found Rx for med but was unsure if she needed to still take med. Pt advise that lab will need to be check to determine if med is still needed but she can take OTC vitamin D.

## 2010-12-22 NOTE — Telephone Encounter (Signed)
Pt stated pharmacy contact phone number was different that our records show. Please call [phone] to contact pharmacy.

## 2010-12-22 NOTE — Telephone Encounter (Signed)
Daughter Suella Broad called to request refill for patient's Vit D2 1.25 mg 50,000 units--has finished her first prescription--patient is staying with family in [place]---needs prescription [facility] into [id number], [address] [place], [place], [place] [phone]---please call daughter Suella Broad [phone] to tell her when prescription will be called in

## 2010-12-23 MED ORDER — PANTOPRAZOLE SODIUM 40 MG PO TBEC: 40.0000 mg | DELAYED_RELEASE_TABLET | Freq: Every day | ORAL | Status: DC

## 2011-01-02 LAB — CBC
MCHC: 33.9
MCV: 88
Platelets: 294
RBC: 3.91
RDW: 12.7

## 2011-01-02 LAB — POCT CARDIAC MARKERS
CKMB, poc: 5.5
Myoglobin, poc: 150
Operator id: 294501

## 2011-01-02 LAB — I-STAT 8, (EC8 V) (CONVERTED LAB)
BUN: 23
Bicarbonate: 21.8
Chloride: 111
Glucose, Bld: 119 — ABNORMAL HIGH
HCT: 35 — ABNORMAL LOW
Hemoglobin: 11.9 — ABNORMAL LOW
Operator id: 294501
pCO2, Ven: 36.8 — ABNORMAL LOW

## 2011-01-02 LAB — DIFFERENTIAL
Basophils Absolute: 0.1
Basophils Relative: 1
Eosinophils Relative: 1
Monocytes Absolute: 0.5
Monocytes Relative: 7

## 2011-01-02 LAB — URINALYSIS, ROUTINE W REFLEX MICROSCOPIC
Glucose, UA: NEGATIVE
Hgb urine dipstick: NEGATIVE
Ketones, ur: NEGATIVE
Protein, ur: NEGATIVE
Urobilinogen, UA: 0.2

## 2011-01-02 LAB — URINE MICROSCOPIC-ADD ON

## 2011-01-04 ENCOUNTER — Other Ambulatory Visit: Payer: Self-pay | Admitting: Family Medicine

## 2011-01-04 DIAGNOSIS — E559 Vitamin D deficiency, unspecified: Secondary | ICD-10-CM

## 2011-01-05 ENCOUNTER — Other Ambulatory Visit (INDEPENDENT_AMBULATORY_CARE_PROVIDER_SITE_OTHER): Payer: Medicare Other

## 2011-01-05 DIAGNOSIS — E559 Vitamin D deficiency, unspecified: Secondary | ICD-10-CM

## 2011-01-05 NOTE — Progress Notes (Signed)
Labs only

## 2011-01-06 ENCOUNTER — Telehealth: Payer: Self-pay

## 2011-01-06 LAB — VITAMIN D 25 HYDROXY (VIT D DEFICIENCY, FRACTURES): Vit D, 25-Hydroxy: 13 ng/mL — ABNORMAL LOW (ref 30–89)

## 2011-01-06 MED ORDER — ERGOCALCIFEROL 1.25 MG (50000 UT) PO CAPS: 50000.0000 [IU] | ORAL_CAPSULE | ORAL | Status: DC

## 2011-01-06 NOTE — Telephone Encounter (Signed)
Message copied by Beverely Low on Fri Jan 06, 2011  1:49 PM ------      Message from: Sheliah Hatch      Created: Fri Jan 06, 2011  7:59 AM       Vit D remains low.  Needs to restart Vit D 50,000 units weekly x12 weeks

## 2011-01-06 NOTE — Telephone Encounter (Signed)
Pt aware and Rx sent to pharmacy  

## 2011-01-09 LAB — BASIC METABOLIC PANEL
CO2: 20
Calcium: 8.9
Chloride: 110
Creatinine, Ser: 1.11
GFR calc Af Amer: 54 — ABNORMAL LOW
GFR calc Af Amer: 59 — ABNORMAL LOW
GFR calc non Af Amer: 45 — ABNORMAL LOW
GFR calc non Af Amer: 49 — ABNORMAL LOW
Potassium: 4.4
Sodium: 138
Sodium: 139

## 2011-01-09 LAB — APTT: aPTT: 32

## 2011-01-09 LAB — PLATELET COUNT: Platelets: 213

## 2011-01-09 LAB — HEPARIN LEVEL (UNFRACTIONATED): Heparin Unfractionated: <0.1 — ABNORMAL LOW

## 2011-01-25 ENCOUNTER — Other Ambulatory Visit (INDEPENDENT_AMBULATORY_CARE_PROVIDER_SITE_OTHER): Payer: Medicare Other | Admitting: *Deleted

## 2011-01-25 ENCOUNTER — Other Ambulatory Visit: Payer: Medicare Other | Admitting: *Deleted

## 2011-01-25 DIAGNOSIS — E782 Mixed hyperlipidemia: Secondary | ICD-10-CM

## 2011-01-25 LAB — LIPID PANEL
Cholesterol: 164 mg/dL (ref 0–200)
LDL Cholesterol: 88 mg/dL (ref 0–99)
Triglycerides: 135 mg/dL (ref 0.0–149.0)

## 2011-01-25 LAB — CK: Total CK: 320 U/L — ABNORMAL HIGH (ref 7–177)

## 2011-01-27 ENCOUNTER — Other Ambulatory Visit: Payer: Medicare Other | Admitting: *Deleted

## 2011-02-06 ENCOUNTER — Other Ambulatory Visit: Payer: Self-pay | Admitting: *Deleted

## 2011-02-06 DIAGNOSIS — E7800 Pure hypercholesterolemia, unspecified: Secondary | ICD-10-CM

## 2011-02-06 MED ORDER — ROSUVASTATIN CALCIUM 5 MG PO TABS: ORAL_TABLET | ORAL | Status: DC

## 2011-02-08 ENCOUNTER — Ambulatory Visit (INDEPENDENT_AMBULATORY_CARE_PROVIDER_SITE_OTHER): Payer: Medicare Other

## 2011-02-08 DIAGNOSIS — Z23 Encounter for immunization: Secondary | ICD-10-CM

## 2011-02-16 ENCOUNTER — Other Ambulatory Visit: Payer: Self-pay | Admitting: Internal Medicine

## 2011-03-03 ENCOUNTER — Ambulatory Visit: Payer: Medicare Other | Admitting: Internal Medicine

## 2011-03-17 ENCOUNTER — Telehealth: Payer: Self-pay | Admitting: Internal Medicine

## 2011-03-17 NOTE — Telephone Encounter (Signed)
New message:  Pt called and needs refill for [facility]rolol [facility] [place]/[phone].  Her number [phone].

## 2011-03-20 NOTE — Telephone Encounter (Signed)
Called [facility] [place] and gave OK to refill Metoprolol XL 25mg  #30 with 6 refills.

## 2011-03-20 NOTE — Telephone Encounter (Signed)
FU Call: Pt daughter calling to check on status of pt refills. Please call these RX's in asap. Pt is out of medication.

## 2011-03-24 ENCOUNTER — Telehealth: Payer: Self-pay | Admitting: Internal Medicine

## 2011-03-24 NOTE — Telephone Encounter (Signed)
Called patient and advised that since she is only taking Metoprolol 1 time per day that the Metoprolol succinate is the correct form of this drug for her to take. Patient verbalized understanding.

## 2011-03-24 NOTE — Telephone Encounter (Signed)
Pt has questions re medication °

## 2011-03-24 NOTE — Telephone Encounter (Signed)
Pt was on Metoprolol tartrate but new bottle that she picked up yesterday reads Metoprolol succinate ER.  Please clarfiy which one she should be on.

## 2011-04-06 ENCOUNTER — Other Ambulatory Visit: Payer: Self-pay | Admitting: *Deleted

## 2011-04-06 DIAGNOSIS — I2510 Atherosclerotic heart disease of native coronary artery without angina pectoris: Secondary | ICD-10-CM

## 2011-04-06 DIAGNOSIS — R5383 Other fatigue: Secondary | ICD-10-CM

## 2011-04-07 ENCOUNTER — Other Ambulatory Visit (INDEPENDENT_AMBULATORY_CARE_PROVIDER_SITE_OTHER): Payer: Medicare Other | Admitting: *Deleted

## 2011-04-07 ENCOUNTER — Other Ambulatory Visit: Payer: Self-pay | Admitting: *Deleted

## 2011-04-07 ENCOUNTER — Telehealth: Payer: Self-pay | Admitting: Internal Medicine

## 2011-04-07 DIAGNOSIS — R5381 Other malaise: Secondary | ICD-10-CM

## 2011-04-07 DIAGNOSIS — E7800 Pure hypercholesterolemia, unspecified: Secondary | ICD-10-CM

## 2011-04-07 DIAGNOSIS — D649 Anemia, unspecified: Secondary | ICD-10-CM

## 2011-04-07 DIAGNOSIS — I2510 Atherosclerotic heart disease of native coronary artery without angina pectoris: Secondary | ICD-10-CM

## 2011-04-07 DIAGNOSIS — R5383 Other fatigue: Secondary | ICD-10-CM

## 2011-04-07 LAB — CBC WITH DIFFERENTIAL/PLATELET
Basophils Relative: 0.6 % (ref 0.0–3.0)
Eosinophils Absolute: 0.2 10*3/uL (ref 0.0–0.7)
HCT: 31.8 % — ABNORMAL LOW (ref 36.0–46.0)
Hemoglobin: 10.8 g/dL — ABNORMAL LOW (ref 12.0–15.0)
Lymphocytes Relative: 32.1 % (ref 12.0–46.0)
Lymphs Abs: 1.8 10*3/uL (ref 0.7–4.0)
MCHC: 34 g/dL (ref 30.0–36.0)
Monocytes Relative: 9.9 % (ref 3.0–12.0)
Neutro Abs: 3.1 10*3/uL (ref 1.4–7.7)
RBC: 3.57 Mil/uL — ABNORMAL LOW (ref 3.87–5.11)

## 2011-04-07 LAB — LIPID PANEL
HDL: 49.3 mg/dL (ref >39.00)
LDL Cholesterol: 76 mg/dL (ref 0–99)
Total CHOL/HDL Ratio: 3
Triglycerides: 111 mg/dL (ref 0.0–149.0)

## 2011-04-07 LAB — BASIC METABOLIC PANEL
CO2: 22 mEq/L (ref 19–32)
Calcium: 9.1 mg/dL (ref 8.4–10.5)
Potassium: 4.4 mEq/L (ref 3.5–5.1)
Sodium: 140 mEq/L (ref 135–145)

## 2011-04-07 LAB — TSH: TSH: 5.51 u[IU]/mL — ABNORMAL HIGH (ref 0.35–5.50)

## 2011-04-07 NOTE — Telephone Encounter (Signed)
New Msg: Pt returning call to our office. Please call back.

## 2011-04-07 NOTE — Telephone Encounter (Signed)
I spoke with the patient. 

## 2011-04-21 ENCOUNTER — Other Ambulatory Visit (INDEPENDENT_AMBULATORY_CARE_PROVIDER_SITE_OTHER): Payer: Medicare Other | Admitting: *Deleted

## 2011-04-21 ENCOUNTER — Encounter: Payer: Self-pay | Admitting: Internal Medicine

## 2011-04-21 ENCOUNTER — Ambulatory Visit (INDEPENDENT_AMBULATORY_CARE_PROVIDER_SITE_OTHER): Payer: Medicare Other | Admitting: Internal Medicine

## 2011-04-21 VITALS — BP 162/75 | HR 63 | Ht 64.96 in | Wt 133.0 lb

## 2011-04-21 DIAGNOSIS — I2510 Atherosclerotic heart disease of native coronary artery without angina pectoris: Secondary | ICD-10-CM

## 2011-04-21 DIAGNOSIS — D649 Anemia, unspecified: Secondary | ICD-10-CM

## 2011-04-21 DIAGNOSIS — I10 Essential (primary) hypertension: Secondary | ICD-10-CM | POA: Diagnosis not present

## 2011-04-21 DIAGNOSIS — E785 Hyperlipidemia, unspecified: Secondary | ICD-10-CM

## 2011-04-21 LAB — CBC WITH DIFFERENTIAL/PLATELET
Basophils Absolute: 0 10*3/uL (ref 0.0–0.1)
Eosinophils Absolute: 0.2 10*3/uL (ref 0.0–0.7)
Lymphocytes Relative: 28.2 % (ref 12.0–46.0)
MCHC: 33.7 g/dL (ref 30.0–36.0)
Neutrophils Relative %: 58.9 % (ref 43.0–77.0)
RDW: 13.4 % (ref 11.5–14.6)

## 2011-04-21 NOTE — Assessment & Plan Note (Signed)
BP on recheck was 140/70.  She says it usually is better.

## 2011-04-21 NOTE — Assessment & Plan Note (Signed)
WIll repeat CBC.

## 2011-04-21 NOTE — Assessment & Plan Note (Signed)
Lipids on last check were good.

## 2011-04-21 NOTE — Progress Notes (Signed)
HPI Patient is a 71 year old with a history of CAD (s/p IWMI in 1995  Underwent PTCA in [place]; Cath in 2009 showed : LM normal; LAD: calcified; LCx: 80% distal; RCA:  80% before PDA.  Underwent PTCA/DES to RCA).   Also has a history of UGI bleeding.  Off plavix. I saw her back in Feb 2012. Labs in December:  LDL 76; BMET normal.  Hgb 10.8  RAH also minimally elevated at 5.51. She denies CP.  Breathing is OK.  No palpitations. Is under increased stress because husband has undergone lung transplant.  Being considered for a second.  Allergies  Allergen Reactions  . Simvastatin     Current Outpatient Prescriptions  Medication Sig Dispense Refill  . amLODipine (NORVASC) 5 MG tablet Take 5 mg by mouth daily.        Marland Kitchen aspirin 81 MG tablet Take 81 mg by mouth daily.        . Calcium Carbonate (CALCI-CHEW PO) Take by mouth. wilth vitamin d 1 tab  On occassion      . ergocalciferol (VITAMIN D2) 50000 UNITS capsule Take 1 capsule (50,000 Units total) by mouth once a week.  12 capsule  0  . ezetimibe (ZETIA) 10 MG tablet Take 10 mg by mouth daily.      . ferrous sulfate 325 (65 FE) MG tablet Take 325 mg by mouth daily.        . metoprolol succinate (TOPROL-XL) 25 MG 24 hr tablet Take 25 mg by mouth daily.        . nitroGLYCERIN (NITROSTAT) 0.4 MG SL tablet Place 0.4 mg under the tongue as needed.        . pantoprazole (PROTONIX) 40 MG tablet Take 1 tablet (40 mg total) by mouth daily.  30 tablet  6  . Polyethyl Glycol-Propyl Glycol (SYSTANE) 0.4-0.3 % SOLN Apply to eye daily as needed.        . rosuvastatin (CRESTOR) 5 MG tablet One half a tab at bedtime  30 tablet  6  . scopolamine (HYOSCINE) 0.25 % ophthalmic solution Apply 1 drop to eye 2 (two) times daily. As needed      . VITAMIN A PO Take 8,000 Units by mouth daily.         Past Medical History  Diagnosis Date  . Hypertension   . GERD (gastroesophageal reflux disease)   . CAD (coronary artery disease)   . Hyperlipidemia   . Aortic  stenosis   . Peptic ulcer disease   . Vertigo   . Osteoporosis     Past Surgical History  Procedure Date  . Ptca     No family history on file.  History   Social History  . Marital Status: Married    Spouse Name: N/A    Number of Children: N/A  . Years of Education: N/A   Occupational History  . Not on file.   Social History Main Topics  . Smoking status: Never Smoker   . Smokeless tobacco: Not on file  . Alcohol Use: No  . Drug Use: No  . Sexually Active:    Other Topics Concern  . Not on file   Social History Narrative  . No narrative on file    Review of Systems:  All systems reviewed.  They are negative to the above problem except as previously stated.  Vital Signs: BP 162/75  Pulse 63  Ht 5' 4.96" (1.65 m)  Wt 133 lb (60.328 kg)  BMI  22.16 kg/m2  Physical Exam  Patient is in NAD  HEENT:  Normocephalic, atraumatic. EOMI, PERRLA.  Neck: JVP is normal. No thyromegaly. No bruits.  Lungs: clear to auscultation. No rales no wheezes.  Heart: Regular rate and rhythm. Normal S1, S2. No S3.   No significant murmurs. PMI not displaced.  Abdomen:  Supple, nontender. Normal bowel sounds. No masses. No hepatomegaly.  Extremities:   Good distal pulses throughout. No lower extremity edema.  Musculoskeletal :moving all extremities.  Neuro:   alert and oriented x3.  CN II-XII grossly intact.  EKG:  Sinus rhythm .  63 bpm  Assessment and Plan:

## 2011-04-21 NOTE — Patient Instructions (Signed)
Your physician wants you to follow-up in: 10 months You will receive a reminder letter in the mail two months in advance. If you don't receive a letter, please call our office to schedule the follow-up appointment.  Call when you want to set up heart ultrasound [phone].

## 2011-04-21 NOTE — Assessment & Plan Note (Signed)
No symptoms to suggest angina. 

## 2011-04-24 ENCOUNTER — Other Ambulatory Visit: Payer: Self-pay | Admitting: *Deleted

## 2011-04-24 DIAGNOSIS — I2510 Atherosclerotic heart disease of native coronary artery without angina pectoris: Secondary | ICD-10-CM

## 2011-04-27 ENCOUNTER — Ambulatory Visit (HOSPITAL_COMMUNITY): Payer: Medicare Other | Attending: Cardiology | Admitting: Radiology

## 2011-04-27 DIAGNOSIS — I252 Old myocardial infarction: Secondary | ICD-10-CM | POA: Diagnosis not present

## 2011-04-27 DIAGNOSIS — I359 Nonrheumatic aortic valve disorder, unspecified: Secondary | ICD-10-CM | POA: Diagnosis not present

## 2011-04-27 DIAGNOSIS — E785 Hyperlipidemia, unspecified: Secondary | ICD-10-CM | POA: Insufficient documentation

## 2011-04-27 DIAGNOSIS — I10 Essential (primary) hypertension: Secondary | ICD-10-CM | POA: Insufficient documentation

## 2011-04-27 DIAGNOSIS — R42 Dizziness and giddiness: Secondary | ICD-10-CM | POA: Insufficient documentation

## 2011-04-27 DIAGNOSIS — I2510 Atherosclerotic heart disease of native coronary artery without angina pectoris: Secondary | ICD-10-CM

## 2011-07-01 ENCOUNTER — Other Ambulatory Visit: Payer: Self-pay | Admitting: Internal Medicine

## 2011-07-03 MED ORDER — EZETIMIBE 10 MG PO TABS: 10.0000 mg | ORAL_TABLET | Freq: Every day | ORAL | Status: DC

## 2011-08-04 DIAGNOSIS — H04129 Dry eye syndrome of unspecified lacrimal gland: Secondary | ICD-10-CM | POA: Diagnosis not present

## 2011-08-04 DIAGNOSIS — H20029 Recurrent acute iridocyclitis, unspecified eye: Secondary | ICD-10-CM | POA: Diagnosis not present

## 2011-08-04 DIAGNOSIS — Z961 Presence of intraocular lens: Secondary | ICD-10-CM | POA: Diagnosis not present

## 2011-08-04 DIAGNOSIS — H52209 Unspecified astigmatism, unspecified eye: Secondary | ICD-10-CM | POA: Diagnosis not present

## 2011-08-29 ENCOUNTER — Encounter: Payer: Self-pay | Admitting: Family Medicine

## 2011-08-29 ENCOUNTER — Ambulatory Visit (INDEPENDENT_AMBULATORY_CARE_PROVIDER_SITE_OTHER): Payer: Medicare Other | Admitting: Family Medicine

## 2011-08-29 VITALS — BP 128/75 | HR 83 | Temp 98.5°F | Ht 62.0 in | Wt 134.2 lb

## 2011-08-29 DIAGNOSIS — R42 Dizziness and giddiness: Secondary | ICD-10-CM

## 2011-08-29 DIAGNOSIS — R5383 Other fatigue: Secondary | ICD-10-CM | POA: Insufficient documentation

## 2011-08-29 DIAGNOSIS — R5381 Other malaise: Secondary | ICD-10-CM

## 2011-08-29 LAB — BASIC METABOLIC PANEL
CO2: 20 mEq/L (ref 19–32)
Chloride: 113 mEq/L — ABNORMAL HIGH (ref 96–112)
Creatinine, Ser: 1.1 mg/dL (ref 0.4–1.2)
Sodium: 141 mEq/L (ref 135–145)

## 2011-08-29 LAB — CBC WITH DIFFERENTIAL/PLATELET
Basophils Absolute: 0.1 10*3/uL (ref 0.0–0.1)
Basophils Relative: 1 % (ref 0.0–3.0)
Hemoglobin: 10.9 g/dL — ABNORMAL LOW (ref 12.0–15.0)
Lymphocytes Relative: 27.7 % (ref 12.0–46.0)
Monocytes Relative: 9.5 % (ref 3.0–12.0)
Neutro Abs: 3 10*3/uL (ref 1.4–7.7)
RBC: 3.67 Mil/uL — ABNORMAL LOW (ref 3.87–5.11)
RDW: 13.1 % (ref 11.5–14.6)
WBC: 5.1 10*3/uL (ref 4.5–10.5)

## 2011-08-29 LAB — TSH: TSH: 4.08 u[IU]/mL (ref 0.35–5.50)

## 2011-08-29 NOTE — Progress Notes (Signed)
  Subjective:    Patient ID: Lisa Murillo, female    DOB: October 06, 1940, 71 y.o.   MRN: [id number]  HPI Fatigue- pt moved from [place] 3 weeks ago after living there for 9 months while husband had lung transplant.  Having intermittent dizziness, 'i don't feel like doing anything- i just sit'.  Has hx of anemia.  Dizziness is occuring w/ position changes- particularly bending.  Limited fluid intake.  Denies sadness.  + stress w/ husband's transplant.  Not sleeping well- going to bed late, 'i seem to come alive at night time'.  Will go to bed at 2am and get up at 9am.   Review of Systems For ROS see HPI     Objective:   Physical Exam  Vitals reviewed. Constitutional: She appears well-developed and well-nourished. No distress.       Frail appearing  HENT:  Head: Normocephalic and atraumatic.  Eyes: Conjunctivae and EOM are normal. Pupils are equal, round, and reactive to light.  Neck: Normal range of motion. Neck supple. No thyromegaly present.  Cardiovascular: Normal rate and regular rhythm.   Murmur (II-III/VI SEM- not new) heard. Pulmonary/Chest: Effort normal and breath sounds normal. No respiratory distress. She has no wheezes. She has no rales.  Skin: Skin is warm and dry.       Skin tenting          Assessment & Plan:

## 2011-08-29 NOTE — Assessment & Plan Note (Signed)
New.  Likely multifactorial- caregiver exhaustion, hx of anemia, dehydration.  Check labs to r/o infxn, electrolyte abnormalities.  Encouraged increased fluids and rest.  Will follow.

## 2011-08-29 NOTE — Patient Instructions (Signed)
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 Call with any questions or concerns Hang in there!!!

## 2011-08-29 NOTE — Assessment & Plan Note (Signed)
New.  Pt w/ clinical signs of volume depletion- skin tenting.  Most likely orthostatic.  Pt and husband report very little fluid intake w/ exception of 2 cups of coffee.  Encouraged increased water intake.  Check electrolytes.  Will follow.

## 2011-09-04 IMAGING — XA IR ANGIO/ADD [PERSON_NAME]
1 series · 12 of 24 positions shown · non-contrast
Comparison: none

CLINICAL DATA: Upper GI bleed requiring transfusion.  Endoscopy has
demonstrated focal bleeding with mucosal protrusion and pulsatility
at the level of the gastric cardia.

[Series 1: run · 12 of 66 slices shown]
[im 3/66]
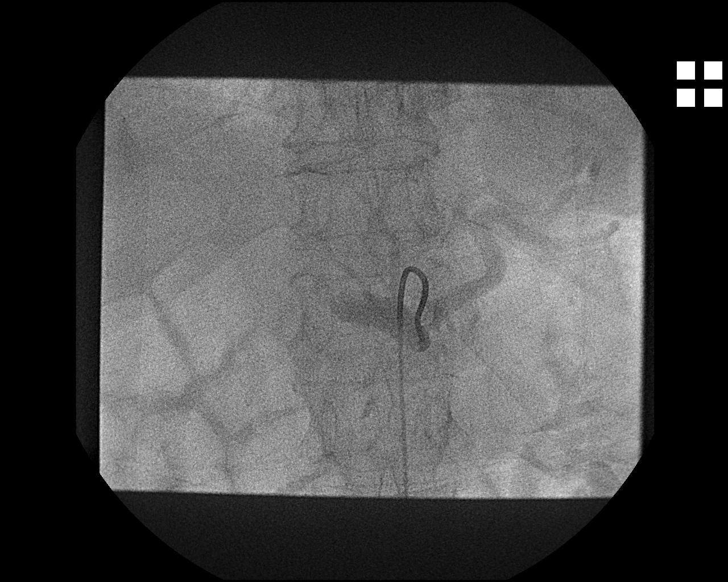
[im 9/66]
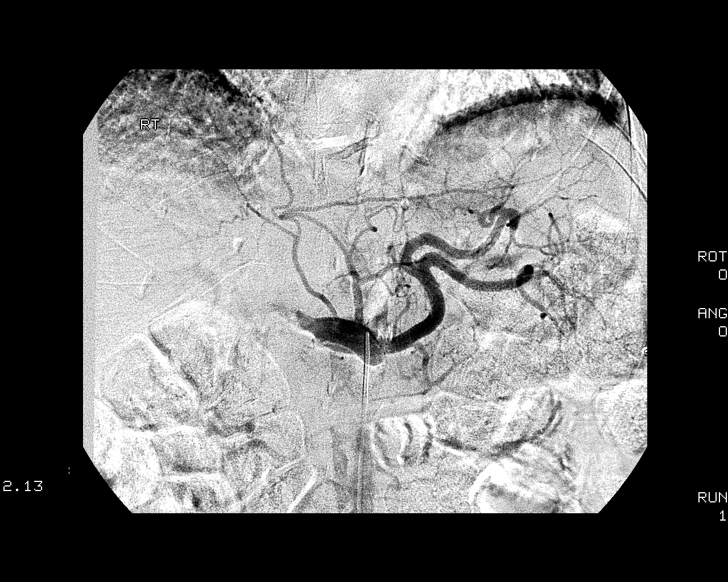
[im 15/66]
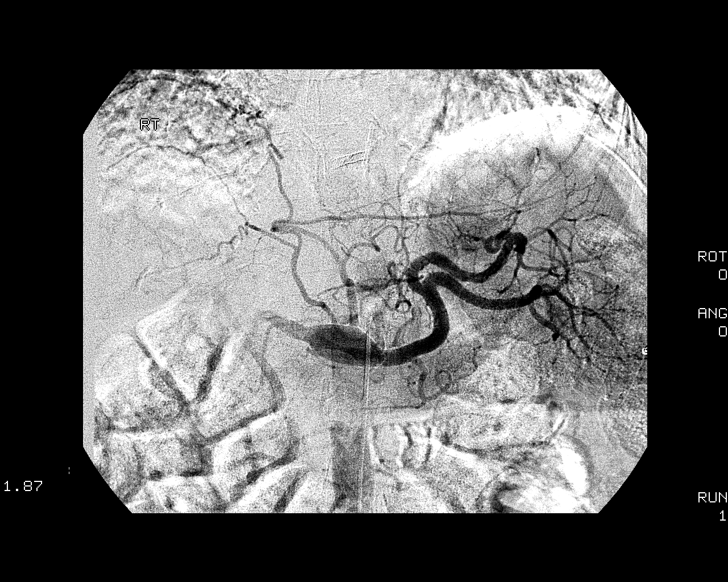
[im 20/66]
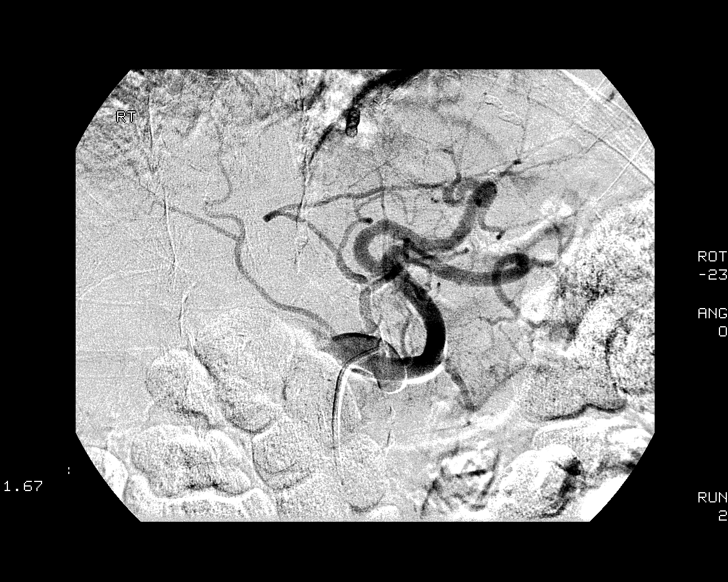
[im 26/66]
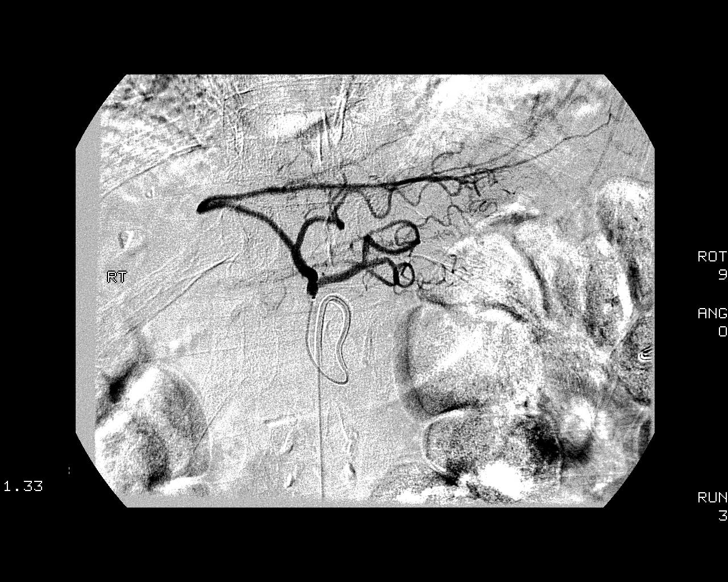
[im 32/66]
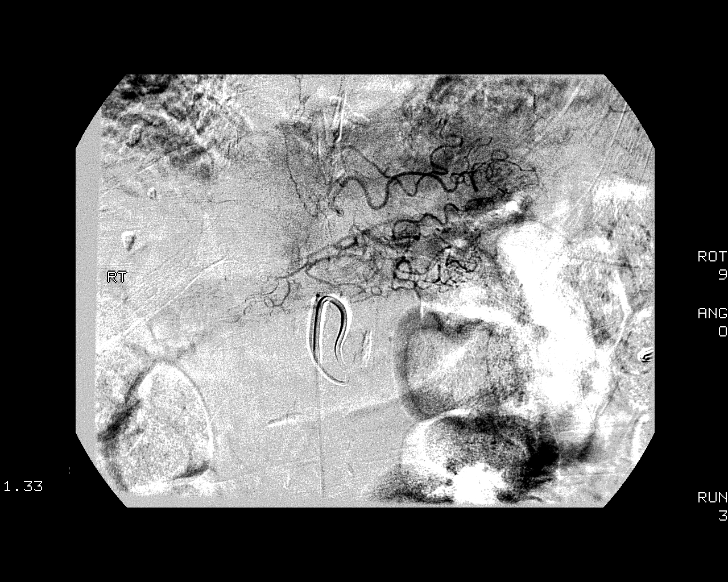
[im 37/66]
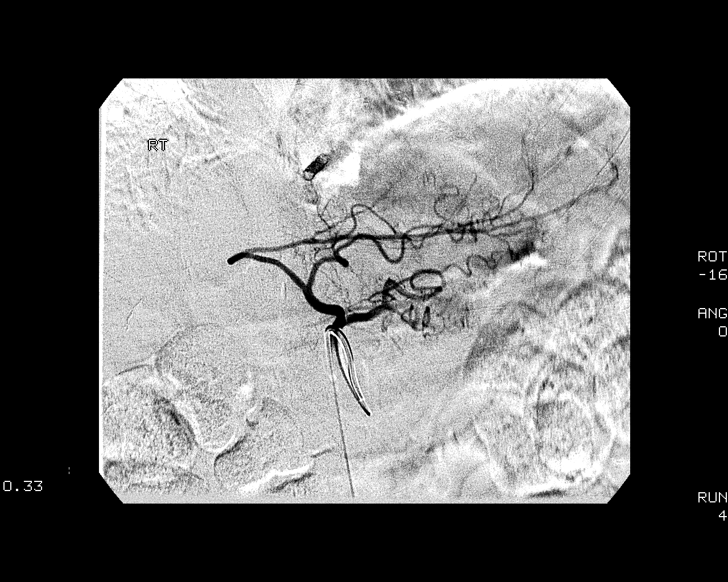
[im 43/66]
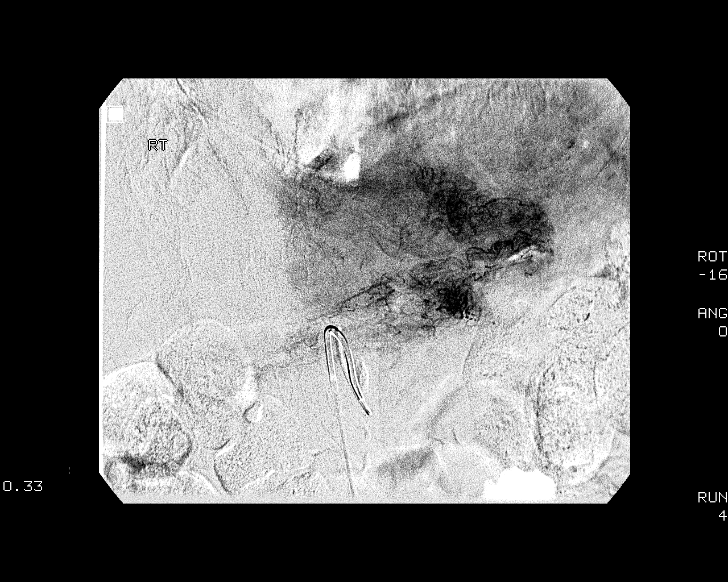
[im 49/66]
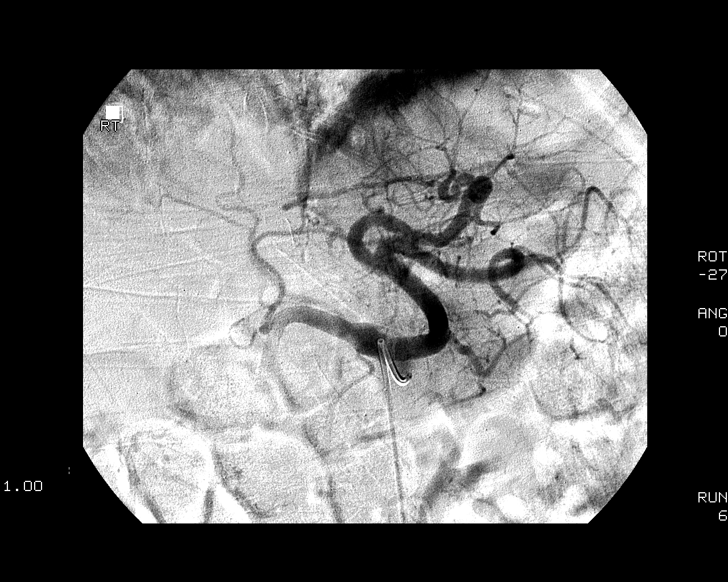
[im 54/66]
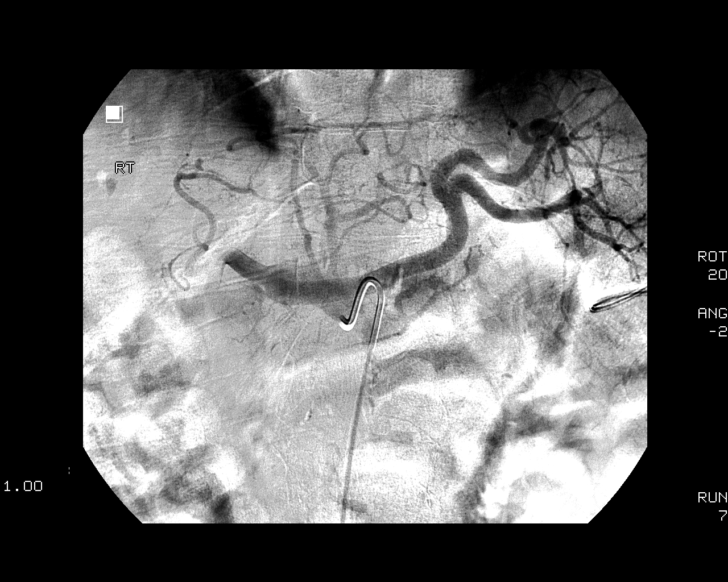
[im 60/66]
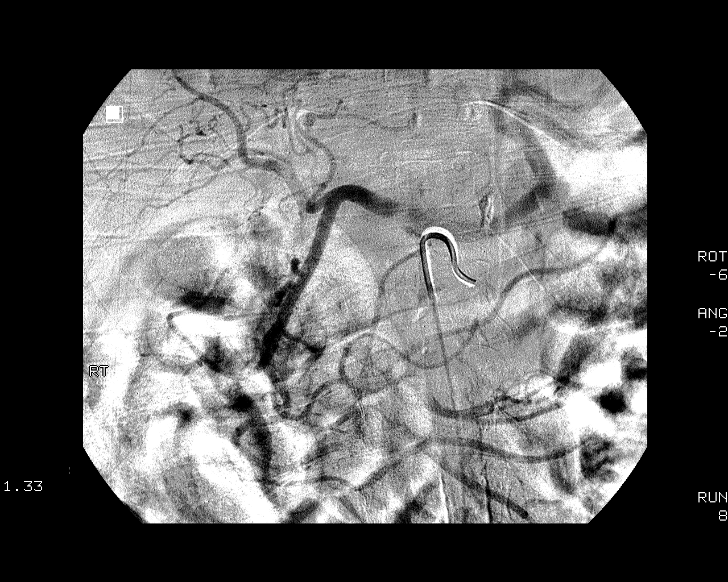
[im 66/66]
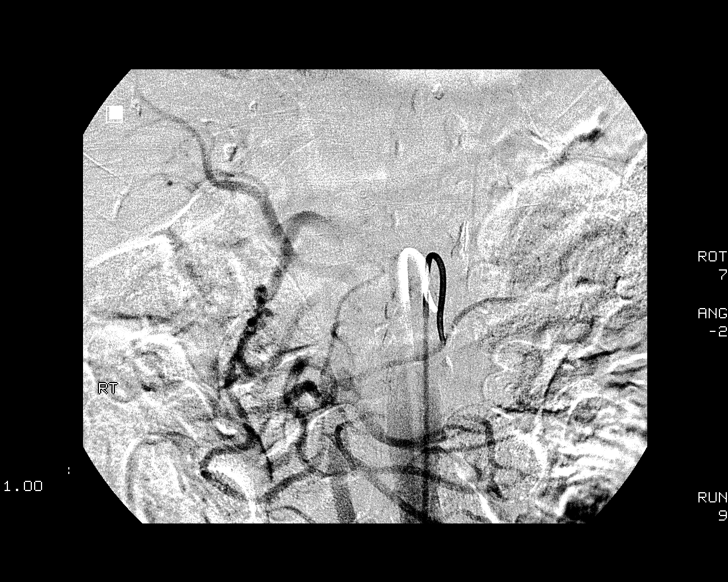

[12 of 24 positions shown; findings below may reference images not displayed]

1.  ULTRASOUND GUIDANCE FOR VASCULAR ACCESS OF THE RIGHT COMMON
FEMORAL ARTERY
2.  CELIAC ARTERIOGRAPHY AND ADDITIONAL SUBSELECTIVE ARTERIOGRAPHY
OF THE LEFT GASTRIC ARTERY
3.  SELECTIVE ARTERIOGRAPHY OF THE SUPERIOR MESENTERIC ARTERY

Contrast:  100 ml Imnipaque-IWW

Fluoroscopy Time: 15.6 minutes.

Procedure:  The procedure, risks, benefits, and alternatives were
explained to the patient.  Questions regarding the procedure were
encouraged and answered.  The patient understands and consents to
the procedure.

The right groin was prepped with betadine in a sterile fashion, and
a sterile drape was applied covering the operative field.  A
sterile gown and sterile gloves were used for the procedure. Local
anesthesia was provided with 1% Lidocaine.  Ultrasound image
documentation was performed.

The right common femoral artery was accessed utilizing a
micropuncture [DATE]-French sheath was placed over a guidewire.
A 5-French Cobra catheter was used to select the celiac axis.
Selective arteriography was performed.  A Renegade microcatheter
was advanced through the 5-French catheter and utilized to attempt
selective catheterization of the left gastric artery.

The Cobra catheter was replaced with a 5-French Sos catheter over a
guidewire.  This was used to select the celiac axis origin.  The
microcatheter was then re-advanced and advanced into the proximal
left gastric artery over a guidewire.  Selective arteriography of
left gastric supply was then performed.  Additional celiac
arteriography was also performed through the 5-French catheter
directed towards the common hepatic artery.

The Sos catheter was then advanced into the proximal superior
mesenteric artery.  Selective arteriography was performed.

After the procedure, the 5-French sheath was left in place as an
arterial line.  It was secured with a Prolene retention suture.

Complications: None
FINDINGS: There is significant stenosis of the celiac origin with
poststenotic dilatation of the celiac axis trunk.  The vessel was
able to be catheterized.  Contrast injection demonstrates widely
patent left gastric, splenic and common hepatic arteries.  Only a
left hepatic artery was identified with celiac injection and there
is retrograde flow noted in the gastroduodenal artery.

Additional selective arteriography of the left gastric artery
showed normal branches supplying the stomach with no evidence of
active extravasation of contrast, arterial abnormality or vascular
malformation.  A right gastric artery was not identified with
celiac arteriography.

Superior mesenteric arteriography demonstrates a replaced right
hepatic artery.  Retrograde flow is also present via the
gastroduodenal artery to the level of the celiac branches due to
the celiac origin stenosis.  No abnormal vascular supply to the
stomach identified with SMA arteriography.
IMPRESSION: No arterial bleed or abnormality identified by celiac and SMA
arteriography.  The left gastric artery was selectively
catheterized and injected and demonstrates no evidence of vascular
abnormality to warrant embolization.  Celiac axis origin was
stenotic with SMA injection showing retrograde flow via the
gastroduodenal artery.  The right hepatic artery is replaced off of
the SMA.

## 2011-09-25 ENCOUNTER — Other Ambulatory Visit: Payer: Self-pay | Admitting: Internal Medicine

## 2011-09-25 NOTE — Telephone Encounter (Signed)
New problem:  Patient calling need a 90 days supply will be leaving for 2-3 months - going to [place].  Leaving on 6/24.

## 2011-09-26 MED ORDER — EZETIMIBE 10 MG PO TABS: 10.0000 mg | ORAL_TABLET | Freq: Every day | ORAL | Status: DC

## 2011-09-26 MED ORDER — METOPROLOL SUCCINATE ER 25 MG PO TB24: 25.0000 mg | ORAL_TABLET | Freq: Every day | ORAL | Status: DC

## 2011-09-26 MED ORDER — ROSUVASTATIN CALCIUM 5 MG PO TABS: 5.0000 mg | ORAL_TABLET | Freq: Every day | ORAL | Status: DC

## 2011-09-26 MED ORDER — PANTOPRAZOLE SODIUM 40 MG PO TBEC: 40.0000 mg | DELAYED_RELEASE_TABLET | Freq: Every day | ORAL | Status: DC

## 2011-09-26 MED ORDER — AMLODIPINE BESYLATE 5 MG PO TABS: 5.0000 mg | ORAL_TABLET | Freq: Every day | ORAL | Status: DC

## 2011-09-27 ENCOUNTER — Telehealth: Payer: Self-pay | Admitting: Internal Medicine

## 2011-09-27 MED ORDER — NITROGLYCERIN 0.4 MG SL SUBL: 0.4000 mg | SUBLINGUAL_TABLET | SUBLINGUAL | Status: DC | PRN

## 2011-09-27 MED ORDER — EZETIMIBE 10 MG PO TABS: 10.0000 mg | ORAL_TABLET | Freq: Every day | ORAL | Status: DC

## 2011-09-27 MED ORDER — ROSUVASTATIN CALCIUM 5 MG PO TABS: 5.0000 mg | ORAL_TABLET | Freq: Every day | ORAL | Status: DC

## 2011-09-27 MED ORDER — METOPROLOL SUCCINATE ER 25 MG PO TB24: 25.0000 mg | ORAL_TABLET | Freq: Every day | ORAL | Status: DC

## 2011-09-27 MED ORDER — AMLODIPINE BESYLATE 5 MG PO TABS: 5.0000 mg | ORAL_TABLET | Freq: Every day | ORAL | Status: DC

## 2011-09-27 NOTE — Telephone Encounter (Signed)
Please return call to patient regarding nitro med, patient will be leaving the country for 3-4 months and needs to get this taken care of.

## 2011-09-27 NOTE — Telephone Encounter (Signed)
Wants refill for 90 days/ completed, pt going to [place] for 3-4 months.

## 2011-09-29 ENCOUNTER — Telehealth: Payer: Self-pay | Admitting: Internal Medicine

## 2011-09-29 NOTE — Telephone Encounter (Signed)
New msg [facility] nitrostat and protonix [facility]. Patient is goin out of country and needs 4 months supply

## 2011-09-29 NOTE — Telephone Encounter (Signed)
Gave auth to dispense 4 months with 1 refill for both listed meds

## 2011-10-09 ENCOUNTER — Telehealth: Payer: Self-pay | Admitting: Family Medicine

## 2011-10-09 MED ORDER — ERGOCALCIFEROL 1.25 MG (50000 UT) PO CAPS: 50000.0000 [IU] | ORAL_CAPSULE | ORAL | Status: DC

## 2011-10-09 NOTE — Telephone Encounter (Signed)
rx sent to pharmacy by e-script  

## 2011-10-09 NOTE — Telephone Encounter (Signed)
Refill: Vitamin D 1-25 (50,000). 1 Q week. Qty 12.  Pharmacy comments: Pt going out of country, need 3 month supply.

## 2012-01-31 ENCOUNTER — Ambulatory Visit (INDEPENDENT_AMBULATORY_CARE_PROVIDER_SITE_OTHER): Payer: Medicare Other

## 2012-01-31 DIAGNOSIS — Z23 Encounter for immunization: Secondary | ICD-10-CM | POA: Diagnosis not present

## 2012-02-12 ENCOUNTER — Telehealth: Payer: Self-pay | Admitting: *Deleted

## 2012-02-12 NOTE — Telephone Encounter (Signed)
Received call from patient's daughter who is currently in [place]. I spoke with her this AM. She advised me that her Mom has some vague complaints including chest discomfort. Offered appointment with PA today but she declined. Wants to wait to see Dr.Ross on Thursday. Appointment made for 830 AM.

## 2012-02-15 ENCOUNTER — Ambulatory Visit (INDEPENDENT_AMBULATORY_CARE_PROVIDER_SITE_OTHER): Payer: Medicare Other | Admitting: Internal Medicine

## 2012-02-15 ENCOUNTER — Encounter: Payer: Self-pay | Admitting: Internal Medicine

## 2012-02-15 VITALS — BP 130/66 | HR 71 | Ht 62.0 in | Wt 124.0 lb

## 2012-02-15 DIAGNOSIS — E782 Mixed hyperlipidemia: Secondary | ICD-10-CM | POA: Diagnosis not present

## 2012-02-15 DIAGNOSIS — R5381 Other malaise: Secondary | ICD-10-CM

## 2012-02-15 DIAGNOSIS — R5383 Other fatigue: Secondary | ICD-10-CM | POA: Diagnosis not present

## 2012-02-15 DIAGNOSIS — R079 Chest pain, unspecified: Secondary | ICD-10-CM

## 2012-02-15 LAB — COMPREHENSIVE METABOLIC PANEL WITH GFR
ALT: 15 U/L (ref 0–35)
AST: 25 U/L (ref 0–37)
Albumin: 4.1 g/dL (ref 3.5–5.2)
Alkaline Phosphatase: 57 U/L (ref 39–117)
BUN: 18 mg/dL (ref 6–23)
CO2: 24 meq/L (ref 19–32)
Calcium: 9 mg/dL (ref 8.4–10.5)
Chloride: 106 meq/L (ref 96–112)
Creatinine, Ser: 1.1 mg/dL (ref 0.4–1.2)
GFR: 50.92 mL/min — ABNORMAL LOW
Glucose, Bld: 106 mg/dL — ABNORMAL HIGH (ref 70–99)
Potassium: 4.3 meq/L (ref 3.5–5.1)
Sodium: 138 meq/L (ref 135–145)
Total Bilirubin: 0.5 mg/dL (ref 0.3–1.2)
Total Protein: 7.3 g/dL (ref 6.0–8.3)

## 2012-02-15 LAB — CBC WITH DIFFERENTIAL/PLATELET
Basophils Relative: 0.8 % (ref 0.0–3.0)
Eosinophils Absolute: 0.1 10*3/uL (ref 0.0–0.7)
Lymphocytes Relative: 22.5 % (ref 12.0–46.0)
MCHC: 33 g/dL (ref 30.0–36.0)
Neutrophils Relative %: 65.7 % (ref 43.0–77.0)
RBC: 3.98 Mil/uL (ref 3.87–5.11)
WBC: 6 10*3/uL (ref 4.5–10.5)

## 2012-02-15 LAB — TSH: TSH: 4.75 u[IU]/mL (ref 0.35–5.50)

## 2012-02-15 LAB — CHOLESTEROL, TOTAL: Cholesterol: 168 mg/dL (ref 0–200)

## 2012-02-15 NOTE — Patient Instructions (Addendum)
Lab work today We will call you with results. 

## 2012-02-15 NOTE — Progress Notes (Signed)
HPI Patient is a 71 year old with a history of CAD (s/p IWMI in 1995 Underwent PTCA in [place]; Cath in 2009 showed : LM normal; LAD: calcified; LCx: 80% distal; RCA: 80% before PDA. Underwent PTCA/DES to RCA).  Also has a history of UGI bleeding. Off plavix.  I saw her back in Feb 2012.  She recently got back from [place]  Had a cold and has since recovered. Last wek she had an episode of chest tightness that started on R then went to L    Was not pleurititc.  Denies SOB. Lasted about1 day.  Gettting better.  Patient says she has been under increased stress  Allergies  Allergen Reactions  . Simvastatin     Current Outpatient Prescriptions  Medication Sig Dispense Refill  . amLODipine (NORVASC) 5 MG tablet Take 1 tablet (5 mg total) by mouth daily.  90 tablet  1  . aspirin 81 MG tablet Take 81 mg by mouth daily.        Marland Kitchen ezetimibe (ZETIA) 10 MG tablet Take 1 tablet (10 mg total) by mouth daily.  90 tablet  1  . metoprolol succinate (TOPROL-XL) 25 MG 24 hr tablet Take 1 tablet (25 mg total) by mouth daily.  90 tablet  1  . nitroGLYCERIN (NITROSTAT) 0.4 MG SL tablet Place 1 tablet (0.4 mg total) under the tongue as needed.  50 tablet  5  . pantoprazole (PROTONIX) 40 MG tablet Take 1 tablet (40 mg total) by mouth daily.  30 tablet  6  . Polyethyl Glycol-Propyl Glycol (SYSTANE OP) Apply to eye. Left eye-- the pt uses when her eyes are bothering her      . rosuvastatin (CRESTOR) 5 MG tablet Take 1 tablet (5 mg total) by mouth daily.  90 tablet  1  . VITAMIN A PO Take 8,000 Units by mouth daily.         Past Medical History  Diagnosis Date  . Hypertension   . GERD (gastroesophageal reflux disease)   . CAD (coronary artery disease)   . Hyperlipidemia   . Aortic stenosis   . Peptic ulcer disease   . Vertigo   . Osteoporosis     Past Surgical History  Procedure Date  . Ptca     No family history on file.  History   Social History  . Marital Status: Married    Spouse Name: N/A      Number of Children: N/A  . Years of Education: N/A   Occupational History  . Not on file.   Social History Main Topics  . Smoking status: Never Smoker   . Smokeless tobacco: Not on file  . Alcohol Use: No  . Drug Use: No  . Sexually Active:    Other Topics Concern  . Not on file   Social History Narrative  . No narrative on file    Review of Systems:  All systems reviewed.  They are negative to the above problem except as previously stated.  Vital Signs: BP 130/66  Pulse 71  Ht 5\' 2"  (1.575 m)  Wt 124 lb (56.246 kg)  BMI 22.68 kg/m2  Physical Exam Patient is a thin 71 yo in NAD HEENT:  Normocephalic, atraumatic. EOMI, PERRLA.  Neck: JVP is normal.    Lungs: clear to auscultation. No rales no wheezes.  Heart: Regular rate and rhythm. Normal S1, S2. No S3.   No significant murmurs. PMI not displaced.  Abdomen:  Supple, nontender. Normal bowel sounds. No  masses. No hepatomegaly.  Extremities:   Good distal pulses throughout. No lower extremity edema.  Musculoskeletal :moving all extremities.  Neuro:   alert and oriented x3.  CN II-XII grossly intact.  EKG:  SR 71 bpm. Assessment and Plan:  1.  CHest pain.  I am not convinced the patient's symptoms represent angina Just recovering from a URI  Increased stress  Seems to be getting beter.  I would check labs.  Follow.  2.  CAD  As above.  3.  GI  Check CBC

## 2012-02-16 LAB — LIPID PANEL
Cholesterol: 171 mg/dL (ref 0–200)
LDL Cholesterol: 95 mg/dL (ref 0–99)
Triglycerides: 132 mg/dL (ref 0.0–149.0)

## 2012-02-17 ENCOUNTER — Other Ambulatory Visit: Payer: Self-pay | Admitting: Internal Medicine

## 2012-02-22 DIAGNOSIS — H16109 Unspecified superficial keratitis, unspecified eye: Secondary | ICD-10-CM | POA: Diagnosis not present

## 2012-03-11 ENCOUNTER — Encounter: Payer: Self-pay | Admitting: Internal Medicine

## 2012-03-11 ENCOUNTER — Ambulatory Visit (INDEPENDENT_AMBULATORY_CARE_PROVIDER_SITE_OTHER): Payer: Medicare Other | Admitting: Internal Medicine

## 2012-03-11 VITALS — BP 124/75 | HR 87 | Ht 62.0 in | Wt 128.0 lb

## 2012-03-11 DIAGNOSIS — R5383 Other fatigue: Secondary | ICD-10-CM

## 2012-03-11 DIAGNOSIS — E559 Vitamin D deficiency, unspecified: Secondary | ICD-10-CM

## 2012-03-11 DIAGNOSIS — R5381 Other malaise: Secondary | ICD-10-CM | POA: Diagnosis not present

## 2012-03-11 NOTE — Patient Instructions (Signed)
Lab work today We will call you with results.  Your physician wants you to follow-up [id number] 2014 You will receive a reminder letter in the mail two months in advance. If you don't receive a letter, please call our office to schedule the follow-up appointment.

## 2012-03-12 ENCOUNTER — Other Ambulatory Visit: Payer: Self-pay | Admitting: *Deleted

## 2012-03-12 ENCOUNTER — Telehealth: Payer: Self-pay | Admitting: Family Medicine

## 2012-03-12 DIAGNOSIS — R5383 Other fatigue: Secondary | ICD-10-CM

## 2012-03-12 MED ORDER — VITAMIN D3 1.25 MG (50000 UT) PO CAPS: 1.0000 | ORAL_CAPSULE | ORAL | Status: DC

## 2012-03-12 NOTE — Progress Notes (Signed)
HPIPatient is a 71 year old with a history of CAD (s/p IWMI in 1995 Underwent PTCA in [place]; Cath in 2009 showed : LM normal; LAD: calcified; LCx: 80% distal; RCA: 80% before PDA. Underwent PTCA/DES to RCA).  Also has a history of UGI bleeding I saw her on Nov 7  At that time she complained of some chest tightness.  I was not convinced cardiac in origin.  She was recovering from a URI.  Blood showed no signif abnormalities. She returns today says that her chest symptoms have resolved.  She does complain of some neck pains and posterior headaches. Breathing is OK  Allergies  Allergen Reactions  . Simvastatin     Current Outpatient Prescriptions  Medication Sig Dispense Refill  . amLODipine (NORVASC) 5 MG tablet Take 1 tablet (5 mg total) by mouth daily.  90 tablet  1  . aspirin 81 MG tablet Take 81 mg by mouth daily.        Marland Kitchen ezetimibe (ZETIA) 10 MG tablet Take 1 tablet (10 mg total) by mouth daily.  90 tablet  1  . metoprolol succinate (TOPROL-XL) 25 MG 24 hr tablet TAKE 1 TABLET (25 MG TOTAL) BY MOUTH DAILY.  90 tablet  1  . nitroGLYCERIN (NITROSTAT) 0.4 MG SL tablet Place 1 tablet (0.4 mg total) under the tongue as needed.  50 tablet  5  . pantoprazole (PROTONIX) 40 MG tablet Take 1 tablet (40 mg total) by mouth daily.  30 tablet  6  . Polyethyl Glycol-Propyl Glycol (SYSTANE OP) Apply to eye. Left eye-- the pt uses when her eyes are bothering her      . rosuvastatin (CRESTOR) 5 MG tablet Take 1 tablet (5 mg total) by mouth daily.  90 tablet  1  . VITAMIN A PO Take 8,000 Units by mouth daily.         Past Medical History  Diagnosis Date  . Hypertension   . GERD (gastroesophageal reflux disease)   . CAD (coronary artery disease)   . Hyperlipidemia   . Aortic stenosis   . Peptic ulcer disease   . Vertigo   . Osteoporosis     Past Surgical History  Procedure Date  . Ptca     No family history on file.  History   Social History  . Marital Status: Married    Spouse Name:  N/A    Number of Children: N/A  . Years of Education: N/A   Occupational History  . Not on file.   Social History Main Topics  . Smoking status: Never Smoker   . Smokeless tobacco: Not on file  . Alcohol Use: No  . Drug Use: No  . Sexually Active:    Other Topics Concern  . Not on file   Social History Narrative  . No narrative on file    Review of Systems:  All systems reviewed.  They are negative to the above problem except as previously stated.  Vital Signs: BP 124/75  Pulse 87  Ht 5\' 2"  (1.575 m)  Wt 128 lb (58.06 kg)  BMI 23.41 kg/m2  SpO2 96%  Physical Exam Patient is in NAD HEENT:  Normocephalic, atraumatic. EOMI, PERRLA.  Neck: JVP is normal.  No bruits. R sided muscles tight Lungs: clear to auscultation. No rales no wheezes.  Heart: Regular rate and rhythm. Normal S1, S2. No S3.   No significant murmurs. PMI not displaced.  Abdomen:  Supple, nontender. Normal bowel sounds. No masses. No hepatomegaly.  Extremities:   Good distal pulses throughout. No lower extremity edema.  Musculoskeletal :moving all extremities.  Neuro:   alert and oriented x3.  CN II-XII grossly intact.   Assessment and Plan:  1.  CP  Resolved.  Did not appear cardiac 2.  CAD  I am not convinced of active symptoms.k 3.  HTN  Adequate control. 4.  HL  Continue crestor  LDL was 95  Has been better  Watch diet.  F/u in 6 mon.

## 2012-03-12 NOTE — Telephone Encounter (Signed)
Message copied by Verner Chol on Tue Mar 12, 2012 10:21 AM ------      Message from: Sheliah Hatch      Created: Mon Mar 11, 2012  9:08 PM       Please schedule her for CPE if not already done (see below)  Thanks!      ----- Message -----         From: Gerome Apley, RN BSN         Sent: 03/11/2012   2:45 PM           To: Sheliah Hatch, MD            Dr.Ross saw her in follow up today.            Can you ask your staff to set her up for a CPX with you in Jan/Feb.      2014 per Dr.Ross.            Thanks

## 2012-03-12 NOTE — Telephone Encounter (Signed)
spoke to pt she approved 2.3.14 at 10:30am for CPE

## 2012-04-17 ENCOUNTER — Other Ambulatory Visit (INDEPENDENT_AMBULATORY_CARE_PROVIDER_SITE_OTHER): Payer: Medicare Other

## 2012-04-17 DIAGNOSIS — R5383 Other fatigue: Secondary | ICD-10-CM

## 2012-04-17 DIAGNOSIS — R5381 Other malaise: Secondary | ICD-10-CM

## 2012-04-17 LAB — BASIC METABOLIC PANEL
BUN: 19 mg/dL (ref 6–23)
Chloride: 110 mEq/L (ref 96–112)
GFR: 49.37 mL/min — ABNORMAL LOW (ref >60.00)
Glucose, Bld: 107 mg/dL — ABNORMAL HIGH (ref 70–99)
Potassium: 4 mEq/L (ref 3.5–5.1)
Sodium: 140 mEq/L (ref 135–145)

## 2012-04-25 ENCOUNTER — Other Ambulatory Visit: Payer: Self-pay | Admitting: Internal Medicine

## 2012-04-26 ENCOUNTER — Telehealth: Payer: Self-pay | Admitting: Internal Medicine

## 2012-04-26 NOTE — Telephone Encounter (Signed)
New Problem:    Patient called in wanting to know what her latest lab results were.  Please call back.

## 2012-04-26 NOTE — Telephone Encounter (Signed)
Called patient and advised last BMET was normal.

## 2012-05-02 ENCOUNTER — Other Ambulatory Visit: Payer: Self-pay | Admitting: Internal Medicine

## 2012-05-03 ENCOUNTER — Telehealth: Payer: Self-pay | Admitting: Internal Medicine

## 2012-05-03 NOTE — Telephone Encounter (Signed)
done

## 2012-05-03 NOTE — Telephone Encounter (Signed)
New problem:   Calling from an over-  se[place]  phone number .    zetia 10 mg.    crestor 5 mg    [facility] [address].

## 2012-05-13 ENCOUNTER — Ambulatory Visit (INDEPENDENT_AMBULATORY_CARE_PROVIDER_SITE_OTHER): Payer: Medicare Other | Admitting: Family Medicine

## 2012-05-13 ENCOUNTER — Encounter: Payer: Self-pay | Admitting: Family Medicine

## 2012-05-13 VITALS — BP 128/70 | HR 75 | Temp 98.3°F | Ht 61.5 in | Wt 128.6 lb

## 2012-05-13 DIAGNOSIS — R05 Cough: Secondary | ICD-10-CM

## 2012-05-13 DIAGNOSIS — E785 Hyperlipidemia, unspecified: Secondary | ICD-10-CM | POA: Diagnosis not present

## 2012-05-13 DIAGNOSIS — M810 Age-related osteoporosis without current pathological fracture: Secondary | ICD-10-CM

## 2012-05-13 DIAGNOSIS — Z0000 Encounter for general adult medical examination without abnormal findings: Secondary | ICD-10-CM

## 2012-05-13 DIAGNOSIS — I10 Essential (primary) hypertension: Secondary | ICD-10-CM

## 2012-05-13 DIAGNOSIS — M79609 Pain in unspecified limb: Secondary | ICD-10-CM | POA: Diagnosis not present

## 2012-05-13 DIAGNOSIS — Z1231 Encounter for screening mammogram for malignant neoplasm of breast: Secondary | ICD-10-CM

## 2012-05-13 DIAGNOSIS — M79673 Pain in unspecified foot: Secondary | ICD-10-CM

## 2012-05-13 NOTE — Progress Notes (Signed)
  Subjective:    Patient ID: Lisa Murillo, female    DOB: 1940-07-29, 72 y.o.   MRN: [id number]  HPI Here today for CPE.  Risk Factors: Hyperlipidemia- chronic problem, on Crestor and Zetia.   Osteoporosis- chronic problem, had terrible reaction to Reclast.  Currently on Vit D but not calcium.  Due for DEXA. HTN- chronic problem, well controlled on Norvasc, Toprol- XL. Cough- sxs started 1 week ago, dry- not productive.  Slowly improving.  No fevers.  Minimal nasal congestion, + sick contacts. R foot pain- 1st noted while climbing stairs.  Pain not noticeable during the day but will be severe at night.  Pain described as a 'dull ache' and a 'burning'.  No known injury. Physical Activity: no regular exercise Fall Risk:  Low risk Depression: no current sxs Hearing: normal to conversational tones and whispered voice at 6 ft ADL's: indpendent Cognitive: normal linear thought process, memory and attention intact Home Safety: safe at home, lives w/ husband. Height, Weight, BMI, Visual Acuity: see vitals, vision corrected to 20/20 w/ glasses Counseling: UTD on colonoscopy, due for DEXA, mammo.  No longer having paps. Labs Ordered: See A&P Care Plan: See A&P    Review of Systems Patient reports no vision/ hearing changes, adenopathy,fever, weight change,  persistant/recurrent hoarseness , swallowing issues, chest pain, palpitations, edema, persistant/recurrent cough, hemoptysis, dyspnea (rest/exertional/paroxysmal nocturnal), gastrointestinal bleeding (melena, rectal bleeding), abdominal pain, significant heartburn, bowel changes, GU symptoms (dysuria, hematuria, incontinence), Gyn symptoms (abnormal  bleeding, pain),  syncope, focal weakness, memory loss, numbness & tingling, skin/hair/nail changes, abnormal bruising or bleeding, anxiety, or depression.     Objective:   Physical Exam General Appearance:    Alert, cooperative, no distress, appears stated age  Head:    Normocephalic,  without obvious abnormality, atraumatic  Eyes:    PERRL, conjunctiva/corneas clear, EOM's intact, fundi    benign, both eyes  Ears:    Normal TM's and external ear canals, both ears  Nose:   Nares normal, septum midline, mucosa normal, no drainage    or sinus tenderness  Throat:   Lips, mucosa, and tongue normal; teeth and gums normal  Neck:   Supple, symmetrical, trachea midline, no adenopathy;    Thyroid: no enlargement/tenderness/nodules  Back:     Symmetric, no curvature, ROM normal, no CVA tenderness  Lungs:     Clear to auscultation bilaterally, respirations unlabored  Chest Wall:    No tenderness or deformity   Heart:    Regular rate and rhythm, S1 and S2 normal, I-II/VI SEM murmur, no rub or gallop  Breast Exam:    Deferred to mammo  Abdomen:     Soft, non-tender, bowel sounds active all four quadrants,    no masses, no organomegaly  Genitalia:    Deferred   Rectal:    Extremities:   Extremities normal, atraumatic, no cyanosis or edema  Pulses:   2+ and symmetric all extremities  Skin:   Skin color, texture, turgor normal, no rashes or lesions  Lymph nodes:   Cervical, supraclavicular, and axillary nodes normal  Neurologic:   CNII-XII intact, normal strength, sensation and reflexes    throughout          Assessment & Plan:

## 2012-05-13 NOTE — Assessment & Plan Note (Signed)
Chronic problem.  Due for DEXA.  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  Will was repleted by cards but only for 7 weeks and not 12.  Will need f/u labs to ensure resolution.  dexa referral made.

## 2012-05-13 NOTE — Assessment & Plan Note (Signed)
Chronic problem.  Reviewed labs done by cards.  No need to repeat.  Pt tolerating meds w/out difficulty.

## 2012-05-13 NOTE — Assessment & Plan Note (Signed)
Chronic problem, well controlled.  Asymptomatic.  Reviewed recent labs from cards.  Will follow.

## 2012-05-13 NOTE — Assessment & Plan Note (Signed)
Pt's PE WNL.  UTD on colonoscopy.  Due for mammo and dexa.  No longer having paps.  Reviewed recent labs from cards.  Anticipatory guidance provided.

## 2012-05-13 NOTE — Assessment & Plan Note (Signed)
New.  Suspect this is due to pt's high arches and amount of time spent on her feet.  Will refer to sports med for evaluation and tx.  Pt expressed understanding and is in agreement w/ plan.

## 2012-05-13 NOTE — Patient Instructions (Signed)
Follow up in 6 months- sooner if needed We'll call you with your sports medicine appt for your feet Continue tylenol as needed for pain We'll call you with your mammogram and bone density appts Your cough is most likely a virus and should continue to improve w/ time Call with any questions or concerns Keep up the good work!

## 2012-05-13 NOTE — Assessment & Plan Note (Signed)
New.  No evidence of bacterial infxn.  Most likely a viral illness as sxs are already improving.  Reviewed supportive care and red flags that should prompt return.  Pt expressed understanding and is in agreement w/ plan.

## 2012-05-16 ENCOUNTER — Ambulatory Visit (INDEPENDENT_AMBULATORY_CARE_PROVIDER_SITE_OTHER): Payer: Medicare Other | Admitting: Family Medicine

## 2012-05-16 ENCOUNTER — Encounter: Payer: Self-pay | Admitting: Family Medicine

## 2012-05-16 VITALS — BP 145/87 | HR 75 | Ht 62.0 in | Wt 130.0 lb

## 2012-05-16 DIAGNOSIS — M79673 Pain in unspecified foot: Secondary | ICD-10-CM

## 2012-05-16 DIAGNOSIS — Q667 Congenital pes cavus, unspecified foot: Secondary | ICD-10-CM

## 2012-05-16 DIAGNOSIS — M79609 Pain in unspecified limb: Secondary | ICD-10-CM | POA: Diagnosis not present

## 2012-05-16 NOTE — Patient Instructions (Addendum)
I agree that your foot pain is primarily due to cavus feet (high arches). This should respond to better arch support with the sports insoles, scaphoid pads. The pain you're experiencing on the outside part of your right foot is likely due to nerve irritation or morton's neuroma. Treatment for this is the extra metatarsal pad I placed in your insole. This should improve but may take weeks to do so. Other less likely considerations would be diabetes, B12 deficiency but this usually presents with numbness in both feet then hands which you do not have. Follow up with me in 1 month for reevaluation. Can consider custom orthotics or show you how to order these in the future depending on how you do. Nerve blocking medications (neurontin) are another consideration if you're struggling.

## 2012-05-18 ENCOUNTER — Other Ambulatory Visit: Payer: Self-pay | Admitting: Internal Medicine

## 2012-05-20 ENCOUNTER — Encounter: Payer: Self-pay | Admitting: Family Medicine

## 2012-05-20 DIAGNOSIS — Q667 Congenital pes cavus, unspecified foot: Secondary | ICD-10-CM | POA: Insufficient documentation

## 2012-05-20 NOTE — Assessment & Plan Note (Signed)
Based on history and exam (though not bothering her currently), pain consistent with irritation from large cavus arches as well as probable digital nerve irritation vs neuroma in right foot.  Start with sports insoles with metatarsal pads.  If does well with these over next month would consider custom orthotics or showing her how to order these.  F/u in 1 month.

## 2012-05-20 NOTE — Progress Notes (Signed)
Subjective:    Patient ID: Lisa Murillo, female    DOB: January 28, 1941, 72 y.o.   MRN: [id number]  PCP: Dr. Beverely Low  HPI 72 yo F here for right > left foot pain. Patient reports she's had this pain for a long time. Primarily deals with pain on dorsum of both feet when walking for a lot or on feet a long time. She also reports having a pins and needles sensation only in right 4th and 5th toes, metatarsals Has not tried anything for pain including medicines, inserts. Bothers her more when using flat shoes (like her rubber slippers).  Past Medical History  Diagnosis Date  . Hypertension   . GERD (gastroesophageal reflux disease)   . CAD (coronary artery disease)   . Hyperlipidemia   . Aortic stenosis   . Peptic ulcer disease   . Vertigo   . Osteoporosis     Current Outpatient Prescriptions on File Prior to Visit  Medication Sig Dispense Refill  . amLODipine (NORVASC) 5 MG tablet Take 1 tablet (5 mg total) by mouth daily.  90 tablet  1  . aspirin 81 MG tablet Take 81 mg by mouth daily.        . Cholecalciferol (VITAMIN D3) 50000 UNITS CAPS Take 1 capsule by mouth as directed.  4 capsule  1  . CRESTOR 5 MG tablet TAKE 1 TABLET (5 MG TOTAL) BY MOUTH DAILY.  90 tablet  1  . metoprolol succinate (TOPROL-XL) 25 MG 24 hr tablet TAKE 1 TABLET (25 MG TOTAL) BY MOUTH DAILY.  90 tablet  1  . nitroGLYCERIN (NITROSTAT) 0.4 MG SL tablet Place 1 tablet (0.4 mg total) under the tongue as needed.  50 tablet  5  . pantoprazole (PROTONIX) 40 MG tablet Take 1 tablet (40 mg total) by mouth daily.  30 tablet  6  . Polyethyl Glycol-Propyl Glycol (SYSTANE OP) Apply to eye. Left eye-- the pt uses when her eyes are bothering her      . VITAMIN A PO Take 8,000 Units by mouth daily.       Marland Kitchen ZETIA 10 MG tablet TAKE 1 TABLET (10 MG TOTAL) BY MOUTH DAILY.  90 tablet  1   No current facility-administered medications on file prior to visit.    Past Surgical History  Procedure Laterality Date  . Ptca       Allergies  Allergen Reactions  . Simvastatin     History   Social History  . Marital Status: Married    Spouse Name: N/A    Number of Children: N/A  . Years of Education: N/A   Occupational History  . Not on file.   Social History Main Topics  . Smoking status: Never Smoker   . Smokeless tobacco: Not on file  . Alcohol Use: No  . Drug Use: No  . Sexually Active: Not on file   Other Topics Concern  . Not on file   Social History Narrative  . No narrative on file    Family History  Problem Relation Age of Onset  . Sudden death Neg Hx   . Hypertension Neg Hx   . Hyperlipidemia Neg Hx   . Heart attack Neg Hx   . Diabetes Neg Hx     BP 145/87  Pulse 75  Ht 5\' 2"  (1.575 m)  Wt 130 lb (58.968 kg)  BMI 23.77 kg/m2  Review of Systems See HPI above.    Objective:   Physical Exam Gen: NAD  Bilateral  feet/ankles: No gross deformity, swelling, ecchymoses Cavus feet. No hallux valgus or rigidus. Mild transverse arch collapse. FROM ankles with 5/5 strength all directions and no pain. No current TTP throughout feet and ankles. Negative ant drawers and talar tilts.   Negative syndesmotic compression. Thompsons test negative. NV intact distally.    Assessment & Plan:  1. 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  Start with sports insoles with metatarsal pads.  If does well with these over next month would consider custom orthotics or showing her how to order these.  F/u in 1 month.

## 2012-06-06 DIAGNOSIS — Z1231 Encounter for screening mammogram for malignant neoplasm of breast: Secondary | ICD-10-CM | POA: Diagnosis not present

## 2012-06-06 DIAGNOSIS — Z13820 Encounter for screening for osteoporosis: Secondary | ICD-10-CM | POA: Diagnosis not present

## 2012-06-13 ENCOUNTER — Telehealth: Payer: Self-pay | Admitting: Family Medicine

## 2012-06-13 NOTE — Telephone Encounter (Signed)
Error. BC °

## 2012-06-26 ENCOUNTER — Encounter: Payer: Self-pay | Admitting: Internal Medicine

## 2012-07-01 ENCOUNTER — Encounter: Payer: Self-pay | Admitting: Family Medicine

## 2012-07-04 ENCOUNTER — Telehealth: Payer: Self-pay | Admitting: *Deleted

## 2012-07-04 NOTE — Telephone Encounter (Signed)
Spoke with patient informed of DEXA results and copy mailed to her per her request.

## 2012-08-01 ENCOUNTER — Ambulatory Visit (INDEPENDENT_AMBULATORY_CARE_PROVIDER_SITE_OTHER): Payer: Medicare Other | Admitting: Internal Medicine

## 2012-08-01 ENCOUNTER — Encounter: Payer: Self-pay | Admitting: Internal Medicine

## 2012-08-01 VITALS — BP 139/78 | HR 74 | Ht 64.0 in | Wt 131.0 lb

## 2012-08-01 DIAGNOSIS — R5383 Other fatigue: Secondary | ICD-10-CM

## 2012-08-01 DIAGNOSIS — M810 Age-related osteoporosis without current pathological fracture: Secondary | ICD-10-CM

## 2012-08-01 DIAGNOSIS — IMO0001 Reserved for inherently not codable concepts without codable children: Secondary | ICD-10-CM

## 2012-08-01 DIAGNOSIS — M899 Disorder of bone, unspecified: Secondary | ICD-10-CM

## 2012-08-01 DIAGNOSIS — M79609 Pain in unspecified limb: Secondary | ICD-10-CM

## 2012-08-01 DIAGNOSIS — E559 Vitamin D deficiency, unspecified: Secondary | ICD-10-CM

## 2012-08-01 DIAGNOSIS — M949 Disorder of cartilage, unspecified: Secondary | ICD-10-CM

## 2012-08-01 DIAGNOSIS — I359 Nonrheumatic aortic valve disorder, unspecified: Secondary | ICD-10-CM | POA: Diagnosis not present

## 2012-08-01 DIAGNOSIS — R5381 Other malaise: Secondary | ICD-10-CM

## 2012-08-01 DIAGNOSIS — I350 Nonrheumatic aortic (valve) stenosis: Secondary | ICD-10-CM

## 2012-08-01 DIAGNOSIS — M791 Myalgia, unspecified site: Secondary | ICD-10-CM

## 2012-08-01 DIAGNOSIS — M79673 Pain in unspecified foot: Secondary | ICD-10-CM

## 2012-08-01 LAB — CBC WITH DIFFERENTIAL/PLATELET
Basophils Absolute: 0.1 10*3/uL (ref 0.0–0.1)
Basophils Relative: 1 % (ref 0.0–3.0)
Eosinophils Absolute: 0.1 10*3/uL (ref 0.0–0.7)
Lymphocytes Relative: 26.1 % (ref 12.0–46.0)
MCHC: 34 g/dL (ref 30.0–36.0)
MCV: 86.4 fl (ref 78.0–100.0)
Monocytes Absolute: 0.6 10*3/uL (ref 0.1–1.0)
Neutrophils Relative %: 58.7 % (ref 43.0–77.0)
Platelets: 244 10*3/uL (ref 150.0–400.0)
RBC: 3.67 Mil/uL — ABNORMAL LOW (ref 3.87–5.11)
RDW: 13 % (ref 11.5–14.6)

## 2012-08-01 LAB — BASIC METABOLIC PANEL
BUN: 20 mg/dL (ref 6–23)
Chloride: 106 mEq/L (ref 96–112)
GFR: 51.38 mL/min — ABNORMAL LOW (ref >60.00)
Potassium: 4.6 mEq/L (ref 3.5–5.1)
Sodium: 137 mEq/L (ref 135–145)

## 2012-08-01 LAB — TSH: TSH: 3.47 u[IU]/mL (ref 0.35–5.50)

## 2012-08-01 NOTE — Progress Notes (Signed)
HPI HPIPatient is a 72 year old with a history of CAD (s/p IWMI in 1995 Underwent PTCA in [place]; Cath in 2009 showed : LM normal; LAD: calcified; LCx: 80% distal; RCA: 80% before PDA. Underwent PTCA/DES to RCA).  Also has a history of UGI bleeding  I saw her on Nov 7 At that time she complained of some chest tightness. I was not convinced cardiac in origin. She was recovering from a URI. Blood showed no signif abnormalities.  In December this had resolved Since then she denies signif CP.  Breathing is OK  She is under increased stress due to their planned move to [place] in a few wks Is bothered by foot pain and also L arm pain.  Allergies  Allergen Reactions  . Simvastatin     Current Outpatient Prescriptions  Medication Sig Dispense Refill  . amLODipine (NORVASC) 5 MG tablet TAKE 1 TABLET (5 MG TOTAL) BY MOUTH DAILY.  90 tablet  1  . aspirin 81 MG tablet Take 81 mg by mouth daily.        . CRESTOR 5 MG tablet TAKE 1 TABLET (5 MG TOTAL) BY MOUTH DAILY.  90 tablet  1  . metoprolol succinate (TOPROL-XL) 25 MG 24 hr tablet TAKE 1 TABLET (25 MG TOTAL) BY MOUTH DAILY.  90 tablet  1  . nitroGLYCERIN (NITROSTAT) 0.4 MG SL tablet Place 1 tablet (0.4 mg total) under the tongue as needed.  50 tablet  5  . pantoprazole (PROTONIX) 40 MG tablet Take 1 tablet (40 mg total) by mouth daily.  30 tablet  6  . Polyethyl Glycol-Propyl Glycol (SYSTANE OP) Apply to eye. Left eye-- the pt uses when her eyes are bothering her      . VITAMIN A PO Take 8,000 Units by mouth daily.       Marland Kitchen ZETIA 10 MG tablet TAKE 1 TABLET (10 MG TOTAL) BY MOUTH DAILY.  90 tablet  1   No current facility-administered medications for this visit.    Past Medical History  Diagnosis Date  . Hypertension   . GERD (gastroesophageal reflux disease)   . CAD (coronary artery disease)   . Hyperlipidemia   . Aortic stenosis   . Peptic ulcer disease   . Vertigo   . Osteoporosis     Past Surgical History  Procedure Laterality Date   . Ptca      Family History  Problem Relation Age of Onset  . Sudden death Neg Hx   . Hypertension Neg Hx   . Hyperlipidemia Neg Hx   . Heart attack Neg Hx   . Diabetes Neg Hx     History   Social History  . Marital Status: Married    Spouse Name: N/A    Number of Children: N/A  . Years of Education: N/A   Occupational History  . Not on file.   Social History Main Topics  . Smoking status: Never Smoker   . Smokeless tobacco: Not on file  . Alcohol Use: No  . Drug Use: No  . Sexually Active: Not on file   Other Topics Concern  . Not on file   Social History Narrative  . No narrative on file    Review of Systems:  All systems reviewed.  They are negative to the above problem except as previously stated.  Vital Signs: BP 139/78  Pulse 74  Ht 5\' 4"  (1.626 m)  Wt 131 lb (59.421 kg)  BMI 22.47 kg/m2  Physical Exam  Patient is in NAD HEENT:  Normocephalic, atraumatic. EOMI, PERRLA.  Neck: JVP is normal.  No bruits.  Lungs: clear to auscultation. No rales no wheezes.  Heart: Regular rate and rhythm. Normal S1, S2. No S3.   No significant murmurs. PMI not displaced.  Abdomen:  Supple, nontender. Normal bowel sounds. No masses. No hepatomegaly.  Extremities:   Good distal pulses throughout. No lower extremity edema.  Musculoskeletal :moving all extremities.  Neuro:   alert and oriented x3.  CN II-XII grossly intact.  EKG  SR 71  Assessment and Plan:  1.  CAD  Aysmptomatic  Follow  2.  HL  Good control in the fall  Keep on meds  3.  HTN  Good control  4.  Ortho  Will refer to ortho for foot and arm pain  5.  GI  No recent bleed.  Will check CBC.  Will be available to see her in future when she returns to [place].

## 2012-08-01 NOTE — Patient Instructions (Addendum)
LABS TODAY:  Cbc, bmet, vitd, tsh  Your physician has requested that you have an echocardiogram. Echocardiography is a painless test that uses sound waves to create images of your heart. It provides your doctor with information about the size and shape of your heart and how well your heart's chambers and valves are working. This procedure takes approximately one hour. There are no restrictions for this procedure.  You have been referred to Dr. Victorino Dike at [facility] for foot pain.

## 2012-08-03 ENCOUNTER — Other Ambulatory Visit: Payer: Self-pay | Admitting: Internal Medicine

## 2012-08-07 DIAGNOSIS — H52209 Unspecified astigmatism, unspecified eye: Secondary | ICD-10-CM | POA: Diagnosis not present

## 2012-08-07 DIAGNOSIS — Z961 Presence of intraocular lens: Secondary | ICD-10-CM | POA: Diagnosis not present

## 2012-08-07 DIAGNOSIS — H20029 Recurrent acute iridocyclitis, unspecified eye: Secondary | ICD-10-CM | POA: Diagnosis not present

## 2012-08-08 ENCOUNTER — Ambulatory Visit (HOSPITAL_COMMUNITY): Payer: Medicare Other | Attending: Cardiology | Admitting: Radiology

## 2012-08-08 ENCOUNTER — Other Ambulatory Visit: Payer: Self-pay

## 2012-08-08 DIAGNOSIS — M810 Age-related osteoporosis without current pathological fracture: Secondary | ICD-10-CM

## 2012-08-08 DIAGNOSIS — M79673 Pain in unspecified foot: Secondary | ICD-10-CM

## 2012-08-08 DIAGNOSIS — I2510 Atherosclerotic heart disease of native coronary artery without angina pectoris: Secondary | ICD-10-CM | POA: Insufficient documentation

## 2012-08-08 DIAGNOSIS — R5383 Other fatigue: Secondary | ICD-10-CM

## 2012-08-08 DIAGNOSIS — M791 Myalgia, unspecified site: Secondary | ICD-10-CM

## 2012-08-08 DIAGNOSIS — I350 Nonrheumatic aortic (valve) stenosis: Secondary | ICD-10-CM

## 2012-08-08 DIAGNOSIS — I10 Essential (primary) hypertension: Secondary | ICD-10-CM | POA: Insufficient documentation

## 2012-08-08 DIAGNOSIS — E785 Hyperlipidemia, unspecified: Secondary | ICD-10-CM | POA: Diagnosis not present

## 2012-08-08 DIAGNOSIS — I359 Nonrheumatic aortic valve disorder, unspecified: Secondary | ICD-10-CM | POA: Diagnosis not present

## 2012-08-08 DIAGNOSIS — M949 Disorder of cartilage, unspecified: Secondary | ICD-10-CM

## 2012-08-08 DIAGNOSIS — E559 Vitamin D deficiency, unspecified: Secondary | ICD-10-CM

## 2012-08-08 DIAGNOSIS — M899 Disorder of bone, unspecified: Secondary | ICD-10-CM

## 2012-08-08 NOTE — Progress Notes (Signed)
Echocardiogram performed.  

## 2012-08-21 ENCOUNTER — Ambulatory Visit (INDEPENDENT_AMBULATORY_CARE_PROVIDER_SITE_OTHER): Payer: Medicare Other | Admitting: Family Medicine

## 2012-08-21 ENCOUNTER — Encounter: Payer: Self-pay | Admitting: Family Medicine

## 2012-08-21 VITALS — BP 118/68 | HR 62 | Temp 98.2°F | Wt 130.0 lb

## 2012-08-21 DIAGNOSIS — G518 Other disorders of facial nerve: Secondary | ICD-10-CM

## 2012-08-21 DIAGNOSIS — L819 Disorder of pigmentation, unspecified: Secondary | ICD-10-CM | POA: Diagnosis not present

## 2012-08-21 NOTE — Progress Notes (Signed)
  Subjective:    Patient ID: Lisa Murillo, female    DOB: 1940-08-06, 72 y.o.   MRN: [id number]  HPI L sided facial pigmentation- occuring on forehead.  Yesterday area got red w/ stress.  Not painful or itchy.  Noticed x2-3 weeks.  Husband doesn't feel this is noticeable.  Shooting pain on side of head- occuring on 1 side (pt not sure which side).  Pt has hx of similar.  Pain is instant and then resolves spontaneously.  Pt cannot relate particular time of day or trigger for sxs.   Review of Systems For ROS see HPI     Objective:   Physical Exam  Vitals reviewed. Constitutional: She is oriented to person, place, and time. She appears well-developed and well-nourished. No distress.  HENT:  Head: Normocephalic and atraumatic.  TMs WNL No TTP over sinuses Minimal nasal congestion  Eyes: Conjunctivae and EOM are normal. Pupils are equal, round, and reactive to light.  Neck: Normal range of motion. Neck supple.  Cardiovascular: Normal rate, regular rhythm, normal heart sounds and intact distal pulses.   Pulmonary/Chest: Effort normal and breath sounds normal. No respiratory distress. She has no wheezes. She has no rales.  Lymphadenopathy:    She has no cervical adenopathy.  Neurological: She is alert and oriented to person, place, and time. She has normal reflexes. No cranial nerve deficit. Coordination normal.  Skin: Skin is warm and dry.  Mild hyperpigmentation on forehead consistent w/ previous sun exposure- no other abnormalities seen  Psychiatric: She has a normal mood and affect. Her behavior is normal. Judgment and thought content normal.          Assessment & Plan:

## 2012-08-21 NOTE — Patient Instructions (Addendum)
The area of discoloration on your forehead appears to be related to sun exposure- this can flush and turn red just like anything else Your flashes of pain are consistent w/ neuralgia.  If they increase in severity or frequency- please call so we can send you to neurology You look great! Please call with any questions or concerns Have a safe trip!

## 2012-08-23 ENCOUNTER — Telehealth: Payer: Self-pay | Admitting: Family Medicine

## 2012-08-23 NOTE — Telephone Encounter (Signed)
Patient states she forgot to ask Dr. Beverely Low about the pain in the joints in her fingers. She would like to know if there is anything she can do.  Pt uses [facility] in [place].

## 2012-08-23 NOTE — Telephone Encounter (Signed)
Take tylenol arthritis as needed for pain

## 2012-08-23 NOTE — Telephone Encounter (Addendum)
Spoke with the Lisa Murillo and informed her of Dr. Rennis Golden recommendation below for her joint pain.   Lisa Murillo understood and agreed.//AB/CMA

## 2012-08-27 DIAGNOSIS — G518 Other disorders of facial nerve: Secondary | ICD-10-CM | POA: Insufficient documentation

## 2012-08-27 DIAGNOSIS — L819 Disorder of pigmentation, unspecified: Secondary | ICD-10-CM | POA: Insufficient documentation

## 2012-08-27 NOTE — Assessment & Plan Note (Signed)
New.  Pt's lightning like pain and spontaneous resolution consistent w/ neuralgia.  Due to pt's age and previous sensitivity to meds, not comfortable w/ pharmacotherapy at this time.  If pt's sxs worsen will refer to neuro for complete w/u.  Will follow.

## 2012-08-27 NOTE — Assessment & Plan Note (Signed)
New.  Mild on forehead.  Consistent w/ previous sun exposure.  Reassurance provided.

## 2012-09-03 DIAGNOSIS — G609 Hereditary and idiopathic neuropathy, unspecified: Secondary | ICD-10-CM | POA: Diagnosis not present

## 2012-10-03 ENCOUNTER — Other Ambulatory Visit: Payer: Self-pay | Admitting: Internal Medicine

## 2012-10-12 ENCOUNTER — Other Ambulatory Visit: Payer: Self-pay | Admitting: Internal Medicine

## 2013-01-08 ENCOUNTER — Telehealth: Payer: Self-pay | Admitting: Internal Medicine

## 2013-01-22 ENCOUNTER — Ambulatory Visit: Payer: Medicare Other | Admitting: Nurse Practitioner

## 2013-01-23 ENCOUNTER — Other Ambulatory Visit: Payer: Self-pay | Admitting: Internal Medicine

## 2013-01-24 ENCOUNTER — Encounter: Payer: Self-pay | Admitting: Internal Medicine

## 2013-01-24 ENCOUNTER — Ambulatory Visit (INDEPENDENT_AMBULATORY_CARE_PROVIDER_SITE_OTHER): Payer: Medicare Other | Admitting: Internal Medicine

## 2013-01-24 VITALS — BP 148/66 | HR 68 | Ht 64.17 in | Wt 125.5 lb

## 2013-01-24 DIAGNOSIS — Z79899 Other long term (current) drug therapy: Secondary | ICD-10-CM | POA: Diagnosis not present

## 2013-01-24 DIAGNOSIS — I359 Nonrheumatic aortic valve disorder, unspecified: Secondary | ICD-10-CM

## 2013-01-24 DIAGNOSIS — Z23 Encounter for immunization: Secondary | ICD-10-CM

## 2013-01-24 DIAGNOSIS — I10 Essential (primary) hypertension: Secondary | ICD-10-CM | POA: Diagnosis not present

## 2013-01-24 DIAGNOSIS — E559 Vitamin D deficiency, unspecified: Secondary | ICD-10-CM | POA: Diagnosis not present

## 2013-01-24 DIAGNOSIS — I2510 Atherosclerotic heart disease of native coronary artery without angina pectoris: Secondary | ICD-10-CM | POA: Diagnosis not present

## 2013-01-24 DIAGNOSIS — Z0000 Encounter for general adult medical examination without abnormal findings: Secondary | ICD-10-CM

## 2013-01-24 DIAGNOSIS — R5381 Other malaise: Secondary | ICD-10-CM | POA: Diagnosis not present

## 2013-01-24 DIAGNOSIS — D649 Anemia, unspecified: Secondary | ICD-10-CM | POA: Diagnosis not present

## 2013-01-24 LAB — LIPID PANEL
LDL Cholesterol: 86 mg/dL (ref 0–99)
Total CHOL/HDL Ratio: 3
VLDL: 24.2 mg/dL (ref 0.0–40.0)

## 2013-01-24 LAB — HEPATIC FUNCTION PANEL
AST: 25 U/L (ref 0–37)
Alkaline Phosphatase: 56 U/L (ref 39–117)
Bilirubin, Direct: 0 mg/dL (ref 0.0–0.3)
Total Bilirubin: 0.5 mg/dL (ref 0.3–1.2)

## 2013-01-24 LAB — FERRITIN: Ferritin: 40.8 ng/mL (ref 10.0–291.0)

## 2013-01-24 LAB — CBC WITH DIFFERENTIAL/PLATELET
Basophils Relative: 0.8 % (ref 0.0–3.0)
Eosinophils Relative: 2.6 % (ref 0.0–5.0)
HCT: 32.5 % — ABNORMAL LOW (ref 36.0–46.0)
Lymphs Abs: 1.2 10*3/uL (ref 0.7–4.0)
Monocytes Relative: 8.7 % (ref 3.0–12.0)
Neutrophils Relative %: 64.9 % (ref 43.0–77.0)
Platelets: 274 10*3/uL (ref 150.0–400.0)
RBC: 3.78 Mil/uL — ABNORMAL LOW (ref 3.87–5.11)
RDW: 13.2 % (ref 11.5–14.6)
WBC: 5.1 10*3/uL (ref 4.5–10.5)

## 2013-01-24 LAB — BASIC METABOLIC PANEL
BUN: 25 mg/dL — ABNORMAL HIGH (ref 6–23)
GFR: 52.4 mL/min — ABNORMAL LOW (ref >60.00)
Potassium: 4.3 mEq/L (ref 3.5–5.1)
Sodium: 139 mEq/L (ref 135–145)

## 2013-01-24 NOTE — Patient Instructions (Addendum)
Your physician recommends that you have lab work today: CBC, BMP. LIVER, LIPID, TSH, Celiac Panel and Ferritin and Iron and TIBC  Please call the office for follow-up when you are back in the [place].  Your physician recommends that you continue on your current medications as directed. Please refer to the Current Medication list given to you today.

## 2013-01-24 NOTE — Progress Notes (Signed)
HPI HPIPatient is a 72 year old with a history of CAD (s/p IWMI in 1995 Underwent PTCA in [place]; Cath in 2009 showed : LM normal; LAD: calcified; LCx: 80% distal; RCA: 80% before PDA. Underwent PTCA/DES to RCA).  Also has a history of UGI bleeding  She was last seen in April 2014  She has since moved to [place]. She denies CP  Breathing is OK  No edema She found a primary MD in [place] but not a cardiologist  Here until Monday.  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  Bowels irregular.  Constipated  Gas. Allergies  Allergen Reactions  . Simvastatin Rash    Current Outpatient Prescriptions  Medication Sig Dispense Refill  . amLODipine (NORVASC) 5 MG tablet TAKE 1 TABLET (5 MG TOTAL) BY MOUTH DAILY.  90 tablet  1  . aspirin 81 MG tablet Take 81 mg by mouth daily.        . Cholecalciferol (VITAMIN D-3) 1000 UNITS CAPS Take 1,000 Units by mouth daily.      . CRESTOR 5 MG tablet TAKE 1 TABLET (5 MG TOTAL) BY MOUTH DAILY.  90 tablet  1  . metoprolol succinate (TOPROL-XL) 25 MG 24 hr tablet TAKE 1 TABLET (25 MG TOTAL) BY MOUTH DAILY.  90 tablet  1  . nitroGLYCERIN (NITROSTAT) 0.4 MG SL tablet Place 1 tablet (0.4 mg total) under the tongue as needed.  50 tablet  5  . pantoprazole (PROTONIX) 40 MG tablet TAKE 1 TABLET EVERY DAY  30 tablet  5  . Polyethyl Glycol-Propyl Glycol (SYSTANE OP) Apply to eye. Left eye-- the pt uses when her eyes are bothering her      . VITAMIN A PO Take 8,000 Units by mouth daily.       Marland Kitchen ZETIA 10 MG tablet TAKE 1 TABLET (10 MG TOTAL) BY MOUTH DAILY.  90 tablet  2   No current facility-administered medications for this visit.    Past Medical History  Diagnosis Date  . Hypertension   . GERD (gastroesophageal reflux disease)   . CAD (coronary artery disease)   . Hyperlipidemia   . Aortic stenosis   . Peptic ulcer disease   . Vertigo   . Osteoporosis     Past Surgical History  Procedure Laterality Date  .  Ptca      Family History  Problem Relation Age of Onset  . Sudden death Neg Hx   . Hypertension Neg Hx   . Hyperlipidemia Neg Hx   . Heart attack Neg Hx   . Diabetes Neg Hx     History   Social History  . Marital Status: Married    Spouse Name: N/A    Number of Children: N/A  . Years of Education: N/A   Occupational History  . Not on file.   Social History Main Topics  . Smoking status: Never Smoker   . Smokeless tobacco: Not on file  . Alcohol Use: No  . Drug Use: No  . Sexual Activity: Not on file   Other Topics Concern  . Not on file   Social History Narrative  . No narrative on file    Review of Systems:  All systems reviewed.  They are negative to the above problem except as previously stated.  Vital Signs: BP 148/66  Pulse 68  Ht 5' 4.17" (1.63 m)  Wt 125 lb 8 oz (56.926 kg)  BMI 21.43 kg/m2  Physical Exam Patient is in NAD HEENT:  Normocephalic, atraumatic. EOMI, PERRLA.  Neck: JVP is normal.  No bruits.  Lungs: clear to auscultation. No rales no wheezes.  Heart: Regular rate and rhythm. Normal S1, S2. No S3.   Gr II/Vi systolic murmur base   PMI not displaced.  Abdomen:  Supple, nontender. Normal bowel sounds. No masses. No hepatomegaly.  Extremities:   Good distal pulses throughout. No lower extremity edema.  Musculoskeletal :moving all extremities.  Neuro:   alert and oriented x3.  CN II-XII grossly intact.  EKG  SR 68 bpm  Assessment and Plan:  1.  CAD  Aysmptomatic  Follow  2.  HL  Will check labs today  3.  HTN  Good control  4.  AS  Mild by echo  Murmur is unchanged    5.  GI  No recent bleed.  Will check CBC.  Check for celiac with food issues  Recommended more fiber  Will be available to see her in future when she returns to [place].

## 2013-01-25 LAB — IRON AND TIBC
%SAT: 17 % — ABNORMAL LOW (ref 20–55)
Iron: 51 ug/dL (ref 42–145)
TIBC: 309 ug/dL (ref 250–470)
UIBC: 258 ug/dL (ref 125–400)

## 2013-01-27 LAB — GLIA (IGA/G) + TTG IGA
Gliadin IgG: 14.3 U/mL (ref <20)
Tissue Transglutaminase Ab, IgA: 4.2 U/mL (ref <20)

## 2013-04-23 ENCOUNTER — Other Ambulatory Visit: Payer: Self-pay | Admitting: Internal Medicine

## 2013-04-28 ENCOUNTER — Other Ambulatory Visit: Payer: Self-pay

## 2013-04-28 MED ORDER — AMLODIPINE BESYLATE 5 MG PO TABS: ORAL_TABLET | ORAL | Status: DC

## 2013-04-28 MED ORDER — EZETIMIBE 10 MG PO TABS: ORAL_TABLET | ORAL | Status: DC

## 2013-04-28 MED ORDER — PANTOPRAZOLE SODIUM 40 MG PO TBEC: DELAYED_RELEASE_TABLET | ORAL | Status: DC

## 2013-07-03 ENCOUNTER — Other Ambulatory Visit: Payer: Self-pay | Admitting: Internal Medicine

## 2013-07-24 ENCOUNTER — Telehealth: Payer: Self-pay | Admitting: Internal Medicine

## 2013-07-24 NOTE — Telephone Encounter (Signed)
New message    Patient would like to set up for blood work when she come in the office on 6/1 .

## 2013-07-24 NOTE — Telephone Encounter (Signed)
Will make dr ross aware. 

## 2013-08-11 ENCOUNTER — Encounter: Payer: Self-pay | Admitting: Internal Medicine

## 2013-09-08 ENCOUNTER — Other Ambulatory Visit: Payer: Self-pay | Admitting: *Deleted

## 2013-09-08 ENCOUNTER — Ambulatory Visit: Payer: Medicare Other | Admitting: Internal Medicine

## 2013-09-08 DIAGNOSIS — D649 Anemia, unspecified: Secondary | ICD-10-CM

## 2013-09-08 DIAGNOSIS — I2510 Atherosclerotic heart disease of native coronary artery without angina pectoris: Secondary | ICD-10-CM

## 2013-09-08 DIAGNOSIS — E569 Vitamin deficiency, unspecified: Secondary | ICD-10-CM

## 2013-09-08 DIAGNOSIS — E7800 Pure hypercholesterolemia, unspecified: Secondary | ICD-10-CM

## 2013-09-08 DIAGNOSIS — R5383 Other fatigue: Secondary | ICD-10-CM

## 2013-09-08 DIAGNOSIS — E538 Deficiency of other specified B group vitamins: Secondary | ICD-10-CM

## 2013-09-08 DIAGNOSIS — Z79899 Other long term (current) drug therapy: Secondary | ICD-10-CM

## 2013-09-09 ENCOUNTER — Other Ambulatory Visit (INDEPENDENT_AMBULATORY_CARE_PROVIDER_SITE_OTHER): Payer: Medicare Other

## 2013-09-09 DIAGNOSIS — R5381 Other malaise: Secondary | ICD-10-CM

## 2013-09-09 DIAGNOSIS — R5383 Other fatigue: Secondary | ICD-10-CM | POA: Diagnosis not present

## 2013-09-09 DIAGNOSIS — Z79899 Other long term (current) drug therapy: Secondary | ICD-10-CM

## 2013-09-09 DIAGNOSIS — E7800 Pure hypercholesterolemia, unspecified: Secondary | ICD-10-CM | POA: Diagnosis not present

## 2013-09-09 DIAGNOSIS — I2510 Atherosclerotic heart disease of native coronary artery without angina pectoris: Secondary | ICD-10-CM | POA: Diagnosis not present

## 2013-09-09 DIAGNOSIS — D649 Anemia, unspecified: Secondary | ICD-10-CM | POA: Diagnosis not present

## 2013-09-09 LAB — HEPATIC FUNCTION PANEL
ALBUMIN: 4 g/dL (ref 3.5–5.2)
ALT: 21 U/L (ref 0–35)
AST: 32 U/L (ref 0–37)
Alkaline Phosphatase: 50 U/L (ref 39–117)
Bilirubin, Direct: 0 mg/dL (ref 0.0–0.3)
Total Bilirubin: 0.6 mg/dL (ref 0.2–1.2)
Total Protein: 6.7 g/dL (ref 6.0–8.3)

## 2013-09-09 LAB — LIPID PANEL
Cholesterol: 155 mg/dL (ref 0–200)
HDL: 50.4 mg/dL (ref >39.00)
LDL Cholesterol: 85 mg/dL (ref 0–99)
TRIGLYCERIDES: 98 mg/dL (ref 0.0–149.0)
Total CHOL/HDL Ratio: 3
VLDL: 19.6 mg/dL (ref 0.0–40.0)

## 2013-09-09 LAB — CBC WITH DIFFERENTIAL/PLATELET
Basophils Absolute: 0 10*3/uL (ref 0.0–0.1)
Basophils Relative: 0.6 % (ref 0.0–3.0)
Eosinophils Absolute: 0.2 10*3/uL (ref 0.0–0.7)
Eosinophils Relative: 4.4 % (ref 0.0–5.0)
HEMATOCRIT: 33.2 % — AB (ref 36.0–46.0)
HEMOGLOBIN: 11.1 g/dL — AB (ref 12.0–15.0)
LYMPHS ABS: 1.5 10*3/uL (ref 0.7–4.0)
LYMPHS PCT: 34.8 % (ref 12.0–46.0)
MCHC: 33.4 g/dL (ref 30.0–36.0)
MCV: 86.3 fl (ref 78.0–100.0)
MONOS PCT: 11.9 % (ref 3.0–12.0)
Monocytes Absolute: 0.5 10*3/uL (ref 0.1–1.0)
NEUTROS ABS: 2.1 10*3/uL (ref 1.4–7.7)
Neutrophils Relative %: 48.3 % (ref 43.0–77.0)
Platelets: 266 10*3/uL (ref 150.0–400.0)
RBC: 3.85 Mil/uL — ABNORMAL LOW (ref 3.87–5.11)
RDW: 13.8 % (ref 11.5–15.5)
WBC: 4.4 10*3/uL (ref 4.0–10.5)

## 2013-09-09 LAB — BASIC METABOLIC PANEL
BUN: 17 mg/dL (ref 6–23)
CALCIUM: 9.1 mg/dL (ref 8.4–10.5)
CO2: 20 mEq/L (ref 19–32)
CREATININE: 1.2 mg/dL (ref 0.4–1.2)
Chloride: 111 mEq/L (ref 96–112)
GFR: 49.17 mL/min — AB (ref >60.00)
Glucose, Bld: 103 mg/dL — ABNORMAL HIGH (ref 70–99)
Potassium: 4.3 mEq/L (ref 3.5–5.1)
Sodium: 138 mEq/L (ref 135–145)

## 2013-09-09 LAB — FERRITIN: FERRITIN: 31.8 ng/mL (ref 10.0–291.0)

## 2013-09-10 ENCOUNTER — Encounter: Payer: Self-pay | Admitting: Internal Medicine

## 2013-09-10 ENCOUNTER — Ambulatory Visit (INDEPENDENT_AMBULATORY_CARE_PROVIDER_SITE_OTHER): Payer: Medicare Other | Admitting: Internal Medicine

## 2013-09-10 ENCOUNTER — Ambulatory Visit (HOSPITAL_COMMUNITY)
Admission: RE | Admit: 2013-09-10 | Discharge: 2013-09-10 | Disposition: A | Discharge status: Home / Self Care | Payer: Medicare Other | Source: Ambulatory Visit | Attending: Internal Medicine | Admitting: Internal Medicine

## 2013-09-10 ENCOUNTER — Other Ambulatory Visit: Payer: Self-pay | Admitting: *Deleted

## 2013-09-10 VITALS — BP 132/68 | HR 65 | Wt 124.0 lb

## 2013-09-10 DIAGNOSIS — M810 Age-related osteoporosis without current pathological fracture: Secondary | ICD-10-CM

## 2013-09-10 DIAGNOSIS — I2510 Atherosclerotic heart disease of native coronary artery without angina pectoris: Secondary | ICD-10-CM

## 2013-09-10 DIAGNOSIS — R5381 Other malaise: Secondary | ICD-10-CM

## 2013-09-10 DIAGNOSIS — Z961 Presence of intraocular lens: Secondary | ICD-10-CM | POA: Diagnosis not present

## 2013-09-10 DIAGNOSIS — D649 Anemia, unspecified: Secondary | ICD-10-CM | POA: Diagnosis not present

## 2013-09-10 DIAGNOSIS — M259 Joint disorder, unspecified: Secondary | ICD-10-CM | POA: Insufficient documentation

## 2013-09-10 DIAGNOSIS — S42033A Displaced fracture of lateral end of unspecified clavicle, initial encounter for closed fracture: Secondary | ICD-10-CM | POA: Diagnosis not present

## 2013-09-10 DIAGNOSIS — Z9181 History of falling: Secondary | ICD-10-CM

## 2013-09-10 DIAGNOSIS — Q667 Congenital pes cavus, unspecified foot: Secondary | ICD-10-CM

## 2013-09-10 DIAGNOSIS — K219 Gastro-esophageal reflux disease without esophagitis: Secondary | ICD-10-CM

## 2013-09-10 DIAGNOSIS — W19XXXA Unspecified fall, initial encounter: Secondary | ICD-10-CM | POA: Insufficient documentation

## 2013-09-10 DIAGNOSIS — M6281 Muscle weakness (generalized): Secondary | ICD-10-CM | POA: Diagnosis not present

## 2013-09-10 DIAGNOSIS — G518 Other disorders of facial nerve: Secondary | ICD-10-CM

## 2013-09-10 DIAGNOSIS — R7402 Elevation of levels of lactic acid dehydrogenase (LDH): Secondary | ICD-10-CM

## 2013-09-10 DIAGNOSIS — R05 Cough: Secondary | ICD-10-CM

## 2013-09-10 DIAGNOSIS — R7401 Elevation of levels of liver transaminase levels: Secondary | ICD-10-CM

## 2013-09-10 DIAGNOSIS — I359 Nonrheumatic aortic valve disorder, unspecified: Secondary | ICD-10-CM | POA: Diagnosis not present

## 2013-09-10 DIAGNOSIS — M216X9 Other acquired deformities of unspecified foot: Secondary | ICD-10-CM

## 2013-09-10 DIAGNOSIS — L819 Disorder of pigmentation, unspecified: Secondary | ICD-10-CM

## 2013-09-10 DIAGNOSIS — I10 Essential (primary) hypertension: Secondary | ICD-10-CM

## 2013-09-10 DIAGNOSIS — R740 Nonspecific elevation of levels of transaminase and lactic acid dehydrogenase [LDH]: Secondary | ICD-10-CM

## 2013-09-10 DIAGNOSIS — I350 Nonrheumatic aortic (valve) stenosis: Secondary | ICD-10-CM

## 2013-09-10 DIAGNOSIS — R5383 Other fatigue: Secondary | ICD-10-CM

## 2013-09-10 DIAGNOSIS — M79609 Pain in unspecified limb: Secondary | ICD-10-CM

## 2013-09-10 DIAGNOSIS — M79673 Pain in unspecified foot: Secondary | ICD-10-CM

## 2013-09-10 DIAGNOSIS — R42 Dizziness and giddiness: Secondary | ICD-10-CM

## 2013-09-10 DIAGNOSIS — H20029 Recurrent acute iridocyclitis, unspecified eye: Secondary | ICD-10-CM | POA: Diagnosis not present

## 2013-09-10 DIAGNOSIS — K279 Peptic ulcer, site unspecified, unspecified as acute or chronic, without hemorrhage or perforation: Secondary | ICD-10-CM

## 2013-09-10 DIAGNOSIS — Z0000 Encounter for general adult medical examination without abnormal findings: Secondary | ICD-10-CM

## 2013-09-10 DIAGNOSIS — E559 Vitamin D deficiency, unspecified: Secondary | ICD-10-CM

## 2013-09-10 DIAGNOSIS — R52 Pain, unspecified: Secondary | ICD-10-CM

## 2013-09-10 DIAGNOSIS — R059 Cough, unspecified: Secondary | ICD-10-CM

## 2013-09-10 DIAGNOSIS — E785 Hyperlipidemia, unspecified: Secondary | ICD-10-CM

## 2013-09-10 DIAGNOSIS — M25569 Pain in unspecified knee: Secondary | ICD-10-CM

## 2013-09-10 LAB — SEDIMENTATION RATE: Sed Rate: 24 mm/hr — ABNORMAL HIGH (ref 0–22)

## 2013-09-10 LAB — VITAMIN D 25 HYDROXY (VIT D DEFICIENCY, FRACTURES): VITD: 38.59 ng/mL

## 2013-09-10 MED ORDER — EZETIMIBE 10 MG PO TABS: ORAL_TABLET | ORAL | Status: DC

## 2013-09-10 MED ORDER — PANTOPRAZOLE SODIUM 40 MG PO TBEC: DELAYED_RELEASE_TABLET | ORAL | Status: DC

## 2013-09-10 MED ORDER — ROSUVASTATIN CALCIUM 5 MG PO TABS: ORAL_TABLET | ORAL | Status: DC

## 2013-09-10 MED ORDER — NITROGLYCERIN 0.4 MG SL SUBL: 0.4000 mg | SUBLINGUAL_TABLET | SUBLINGUAL | Status: DC | PRN

## 2013-09-10 MED ORDER — LUBIPROSTONE 8 MCG PO CAPS: 8.0000 ug | ORAL_CAPSULE | Freq: Two times a day (BID) | ORAL | Status: DC

## 2013-09-10 MED ORDER — AMLODIPINE BESYLATE 5 MG PO TABS: ORAL_TABLET | ORAL | Status: DC

## 2013-09-10 MED ORDER — METOPROLOL SUCCINATE ER 25 MG PO TB24: ORAL_TABLET | ORAL | Status: DC

## 2013-09-10 NOTE — Progress Notes (Signed)
HPI HPIPatient is a 73 year old with a history of CAD (s/p IWMI in 1995 Underwent PTCA in [place]; Cath in 2009 showed : LM normal; LAD: calcified; LCx: 80% distal; RCA: 80% before PDA. Underwent PTCA/DES to RCA).  Also has a history of UGI bleeding, HTN, mild aortic stenosis (bicuspid valve)  She was last seen in cardiology clinic in October 2014.   Since seen she denies CP  Breathing is stable  She does activity around house   Tired  Doesn't sleep well   Stressed.  Doesn't ask for help  Worried of more problems (theft, things being done correctly) Still with GI complaints (gas, no diarrhea, symptoms worse with eating)  Eats little   Allergies  Allergen Reactions  . Simvastatin Rash    Current Outpatient Prescriptions  Medication Sig Dispense Refill  . amLODipine (NORVASC) 5 MG tablet TAKE 1 TABLET (5 MG TOTAL) BY MOUTH DAILY.  90 tablet  3  . aspirin 81 MG tablet Take 81 mg by mouth daily.        . CRESTOR 5 MG tablet TAKE 1 TABLET (5 MG TOTAL) BY MOUTH DAILY.  90 tablet  1  . ezetimibe (ZETIA) 10 MG tablet TAKE 1 TABLET (10 MG TOTAL) BY MOUTH DAILY.  90 tablet  3  . metoprolol succinate (TOPROL-XL) 25 MG 24 hr tablet TAKE 1 TABLET (25 MG TOTAL) BY MOUTH DAILY.  90 tablet  1  . nitroGLYCERIN (NITROSTAT) 0.4 MG SL tablet Place 1 tablet (0.4 mg total) under the tongue as needed.  50 tablet  5  . pantoprazole (PROTONIX) 40 MG tablet TAKE 1 TABLET EVERY DAY  90 tablet  3  . Polyethyl Glycol-Propyl Glycol (SYSTANE OP) Apply to eye. Left eye-- the pt uses when her eyes are bothering her       No current facility-administered medications for this visit.    Past Medical History  Diagnosis Date  . Hypertension   . GERD (gastroesophageal reflux disease)   . CAD (coronary artery disease)   . Hyperlipidemia   . Aortic stenosis   . Peptic ulcer disease   . Vertigo   . Osteoporosis     Past Surgical History  Procedure Laterality Date  . Ptca      Family History  Problem Relation Age of  Onset  . Sudden death Neg Hx   . Hypertension Neg Hx   . Hyperlipidemia Neg Hx   . Heart attack Neg Hx   . Diabetes Neg Hx     History   Social History  . Marital Status: Married    Spouse Name: N/A    Number of Children: N/A  . Years of Education: N/A   Occupational History  . Not on file.   Social History Main Topics  . Smoking status: Never Smoker   . Smokeless tobacco: Not on file  . Alcohol Use: No  . Drug Use: No  . Sexual Activity: Not on file   Other Topics Concern  . Not on file   Social History Narrative  . No narrative on file    Review of Systems:  All systems reviewed.  They are negative to the above problem except as previously stated.  Vital Signs: BP 132/68  Pulse 65  Wt 124 lb (56.246 kg)  Physical Exam Patient is in NAD HEENT:  Normocephalic, atraumatic. EOMI, PERRLA.  Neck: JVP is normal.  No bruits.  Lungs: clear to auscultation. No rales no wheezes.  Heart: Regular rate and  rhythm. Normal S1, S2. No S3.   Gr II/Vi systolic murmur base   PMI not displaced. Chest  Hard, bony prominence L shoulder   Abdomen:  Supple, nontender. Increased  bowel sounds. No masses. No hepatomegaly.  Extremities:   Good distal pulses throughout. No lower extremity edema.  Musculoskeletal :moving all extremities.  Neuro:   alert and oriented x3.  CN II-XII grossly intact.  EKG  SR 65 bpm   Assessment and Plan:  1.  CAD No symptoms of angina  2.  HL Adequate control    3.  HTN  Good control  4.  AS  Mild by echo in  May 2014  Murmur suggests still in this range  Would get echo next fall  5.  Anemia.  Full panel pending.  Stable  Very mildly anemic  6.  GI  Discussed symptoms with D Brodie.  She suggests trial of Align and Amitiza (8mcg bid) Consider USN of GB if symptoms do not improve  Can do in [place].  7 Psych  Patient has remained stressed.  Increased since husbands lung transplant and recent home break ins  Sugg that she talk to a psychologist,  counsellor  May be one at tx center in [place]  8  Vit D  With achiness and hx of deficiency will get Vit D Level.

## 2013-09-10 NOTE — Patient Instructions (Addendum)
Your physician has requested that you have an echocardiogram in October same day as Tenny Craw appt. Echocardiography is a painless test that uses sound waves to create images of your heart. It provides your doctor with information about the size and shape of your heart and how well your heart's chambers and valves are working. This procedure takes approximately one hour. There are no restrictions for this procedure.  Your physician recommends that you schedule a follow-up appointment in: October with DR. Ross have Echo same day   Your physician has recommended you make the following change in your medication:  1. START AMITIZA 8 MG TWICE DAILY   Your physician recommends that you havelab work today: SEDRATE,VIT D   A chest x-ray takes a picture of the organs and structures inside the chest, including the heart, lungs, and blood vessels. This test can show several things, including, whether the heart is enlarges; whether fluid is building up in the lungs; and whether pacemaker / defibrillator leads are still in place.

## 2013-09-11 ENCOUNTER — Telehealth: Payer: Self-pay | Admitting: *Deleted

## 2013-09-11 NOTE — Telephone Encounter (Signed)
PA  For [place] to [employer]

## 2013-11-04 ENCOUNTER — Encounter: Payer: Self-pay | Admitting: Internal Medicine

## 2013-12-17 ENCOUNTER — Other Ambulatory Visit (INDEPENDENT_AMBULATORY_CARE_PROVIDER_SITE_OTHER): Payer: Medicare Other

## 2013-12-17 ENCOUNTER — Other Ambulatory Visit: Payer: Self-pay | Admitting: *Deleted

## 2013-12-17 ENCOUNTER — Telehealth: Payer: Self-pay | Admitting: Internal Medicine

## 2013-12-17 DIAGNOSIS — I10 Essential (primary) hypertension: Secondary | ICD-10-CM

## 2013-12-17 LAB — BASIC METABOLIC PANEL
BUN: 21 mg/dL (ref 6–23)
CALCIUM: 8.9 mg/dL (ref 8.4–10.5)
CO2: 20 meq/L (ref 19–32)
CREATININE: 1.2 mg/dL (ref 0.4–1.2)
Chloride: 111 mEq/L (ref 96–112)
GFR: 48.65 mL/min — AB (ref >60.00)
GLUCOSE: 98 mg/dL (ref 70–99)
Potassium: 4 mEq/L (ref 3.5–5.1)
Sodium: 138 mEq/L (ref 135–145)

## 2013-12-17 LAB — CBC
HEMATOCRIT: 32.3 % — AB (ref 36.0–46.0)
HEMOGLOBIN: 10.9 g/dL — AB (ref 12.0–15.0)
MCHC: 33.7 g/dL (ref 30.0–36.0)
MCV: 86.8 fl (ref 78.0–100.0)
PLATELETS: 253 10*3/uL (ref 150.0–400.0)
RBC: 3.73 Mil/uL — AB (ref 3.87–5.11)
RDW: 13.4 % (ref 11.5–15.5)
WBC: 4.7 10*3/uL (ref 4.0–10.5)

## 2013-12-17 NOTE — Telephone Encounter (Signed)
Lab orders placed.  Appointment made. Patient will be in this am for blood work.

## 2013-12-17 NOTE — Telephone Encounter (Signed)
New message           Pt daughter is calling to come in for labs / pt says dr Tenny Craw was suppose to put in orders for labs for today

## 2013-12-18 ENCOUNTER — Other Ambulatory Visit: Payer: Medicare Other

## 2014-02-09 ENCOUNTER — Encounter: Payer: Self-pay | Admitting: Internal Medicine

## 2014-07-13 ENCOUNTER — Ambulatory Visit (HOSPITAL_COMMUNITY): Payer: Medicare Other | Attending: Cardiology | Admitting: Cardiology

## 2014-07-13 ENCOUNTER — Other Ambulatory Visit: Payer: Medicare Other

## 2014-07-13 ENCOUNTER — Ambulatory Visit (INDEPENDENT_AMBULATORY_CARE_PROVIDER_SITE_OTHER): Payer: Medicare Other | Admitting: Internal Medicine

## 2014-07-13 ENCOUNTER — Encounter: Payer: Self-pay | Admitting: Internal Medicine

## 2014-07-13 VITALS — BP 144/70 | HR 55 | Ht 64.7 in | Wt 117.4 lb

## 2014-07-13 DIAGNOSIS — I350 Nonrheumatic aortic (valve) stenosis: Secondary | ICD-10-CM

## 2014-07-13 DIAGNOSIS — I10 Essential (primary) hypertension: Secondary | ICD-10-CM | POA: Diagnosis not present

## 2014-07-13 DIAGNOSIS — E785 Hyperlipidemia, unspecified: Secondary | ICD-10-CM

## 2014-07-13 DIAGNOSIS — R5383 Other fatigue: Secondary | ICD-10-CM | POA: Diagnosis not present

## 2014-07-13 DIAGNOSIS — M810 Age-related osteoporosis without current pathological fracture: Secondary | ICD-10-CM

## 2014-07-13 LAB — LIPID PANEL
CHOL/HDL RATIO: 3
Cholesterol: 138 mg/dL (ref 0–200)
HDL: 49.2 mg/dL (ref >39.00)
LDL Cholesterol: 71 mg/dL (ref 0–99)
NONHDL: 88.8
Triglycerides: 88 mg/dL (ref 0.0–149.0)
VLDL: 17.6 mg/dL (ref 0.0–40.0)

## 2014-07-13 LAB — COMPREHENSIVE METABOLIC PANEL
ALBUMIN: 3.7 g/dL (ref 3.5–5.2)
ALT: 16 U/L (ref 0–35)
AST: 23 U/L (ref 0–37)
Alkaline Phosphatase: 51 U/L (ref 39–117)
BUN: 20 mg/dL (ref 6–23)
CALCIUM: 8.7 mg/dL (ref 8.4–10.5)
CHLORIDE: 111 meq/L (ref 96–112)
CO2: 20 mEq/L (ref 19–32)
Creatinine, Ser: 0.94 mg/dL (ref 0.40–1.20)
GFR: 61.91 mL/min (ref >60.00)
Glucose, Bld: 97 mg/dL (ref 70–99)
Potassium: 4.2 mEq/L (ref 3.5–5.1)
Sodium: 138 mEq/L (ref 135–145)
TOTAL PROTEIN: 6.4 g/dL (ref 6.0–8.3)
Total Bilirubin: 0.4 mg/dL (ref 0.2–1.2)

## 2014-07-13 LAB — CBC
HEMATOCRIT: 32.5 % — AB (ref 36.0–46.0)
HEMOGLOBIN: 10.9 g/dL — AB (ref 12.0–15.0)
MCHC: 33.6 g/dL (ref 30.0–36.0)
MCV: 85.7 fl (ref 78.0–100.0)
PLATELETS: 255 10*3/uL (ref 150.0–400.0)
RBC: 3.79 Mil/uL — ABNORMAL LOW (ref 3.87–5.11)
RDW: 13.5 % (ref 11.5–15.5)
WBC: 4.6 10*3/uL (ref 4.0–10.5)

## 2014-07-13 LAB — TSH: TSH: 2.84 u[IU]/mL (ref 0.35–4.50)

## 2014-07-13 MED ORDER — DICLOFENAC SODIUM 1 % TD GEL: 2.0000 g | Freq: Four times a day (QID) | TRANSDERMAL | Status: AC

## 2014-07-13 NOTE — Progress Notes (Signed)
Cardiology Office Note   Date:  07/13/2014   ID:  Lisa Murillo, DOB January 12, 1941, MRN [id number]  PCP:  Annye Asa, MD  Cardiologist:   Dorris Carnes, MD   patinet returns for f/u of CAD     History of Present Illness: Lisa Murillo is a 74 y.o. female with a history of  CAD (s/p IWMI in [place] in [place]; Cath in 2009 showed : LM normal; LAD: calcified; LCx: 80% distal; RCA: 80% before PDA. Underwent PTCA/DES to RCA).  Also has a history of UGI bleeding, HTN, mild aortic stenosis (bicuspid valve)  She was last seen in cardiology clinic in 201          Since seen she denies CP  Breathing is stable.   Shis feels very stressed  Is tired.  Husband s/p lung transplant  Patient says she is cooking and cleaning all the time   Appetite not very good  Still has GI upset       Current Outpatient Prescriptions  Medication Sig Dispense Refill  . amLODipine (NORVASC) 5 MG tablet TAKE 1 TABLET (5 MG TOTAL) BY MOUTH DAILY. 90 tablet 3  . aspirin 81 MG tablet Take 81 mg by mouth daily.      Marland Kitchen ezetimibe (ZETIA) 10 MG tablet TAKE 1 TABLET (10 MG TOTAL) BY MOUTH DAILY. 90 tablet 3  . metoprolol succinate (TOPROL-XL) 25 MG 24 hr tablet TAKE 1 TABLET (25 MG TOTAL) BY MOUTH DAILY. 90 tablet 3  . nitroGLYCERIN (NITROSTAT) 0.4 MG SL tablet Place 1 tablet (0.4 mg total) under the tongue as needed. 50 tablet 5  . pantoprazole (PROTONIX) 40 MG tablet Take 40 mg by mouth daily.    Vladimir Faster Glycol-Propyl Glycol (SYSTANE OP) Apply to eye. Left eye-- the pt uses when her eyes are bothering her    . rosuvastatin (CRESTOR) 5 MG tablet TAKE 1 TABLET (5 MG TOTAL) BY MOUTH DAILY. 90 tablet 3   No current facility-administered medications for this visit.    Allergies:   Simvastatin   Past Medical History  Diagnosis Date  . Hypertension   . GERD (gastroesophageal reflux disease)   . CAD (coronary artery disease)   . Hyperlipidemia   . Aortic stenosis   . Peptic ulcer  disease   . Vertigo   . Osteoporosis     Past Surgical History  Procedure Laterality Date  . Ptca       Social History:  The patient  reports that she has never smoked. She does not have any smokeless tobacco history on file. She reports that she does not drink alcohol or use illicit drugs.   Family History:  The patient's family history is negative for Sudden death, Hypertension, Hyperlipidemia, Heart attack, and Diabetes.    ROS:  Please see the history of present illness. All other systems are reviewed and  Negative to the above problem except as noted.    PHYSICAL EXAM: VS:  BP 144/70 mmHg  Pulse 55  Ht 5' 4.7" (1.643 m)  Wt 117 lb 6.4 oz (53.252 kg)  BMI 19.73 kg/m2  SpO2 97%  GEN: Well nourished, well developed, in no acute distress HEENT: normal Neck: no JVD, carotid bruits, or masses Cardiac: RRR; Gr II/VI systolic murmur base  No, rubs, or gallops,no edema  Respiratory:  clear to auscultation bilaterally, normal work of breathing GI: soft, nontender, nondistended, + BS  No hepatomegaly  MS: no deformity Moving all extremities   Skin:  warm and dry, no rash Neuro:  Strength and sensation are intact Psych: euthymic mood, full affect   EKG:  EKG is ordered today.   Lipid Panel    Component Value Date/Time   CHOL 138 07/13/2014 1107   TRIG 88.0 07/13/2014 1107   HDL 49.20 07/13/2014 1107   CHOLHDL 3 07/13/2014 1107   VLDL 17.6 07/13/2014 1107   LDLCALC 71 07/13/2014 1107   LDLDIRECT 144.0 07/22/2010 0948      Wt Readings from Last 3 Encounters:  07/13/14 117 lb 6.4 oz (53.252 kg)  09/10/13 124 lb (56.246 kg)  01/24/13 125 lb 8 oz (56.926 kg)      ASSESSMENT AND PLAN: 1  CAD  No symptoms that suggest angina.  I would keep on same regimen  2.   AS I have reviewed echo  AS rmains mild  LVEF normal  3HL  Keep on current regimen  4.  GI  Hgb is stable  No changes  Encouraged her to add colon health to regimen  Eat small meals and drink adeq  fluids  5  Arthritis.  Patient with pain in hands  Exam with evid of osteoarthritis  Given Rx for voltaren gel to use sparingly      Current medicines are reviewed at length with the patient today.  The patient does not have concerns regarding medicines.  The following changes have been made:   Labs/ tests ordered today include:  Orders Placed This Encounter  Procedures  . TSH  . Lipid Profile  . CBC  . Comp Met (CMET)     Disposition:   FU with me when returns to [place]  Goes back to [place] Wed    Signed, Dorris Carnes, MD  07/13/2014 5:19 PM    [address], [place], [place]  [address] Phone: [phone]; Fax: [phone]

## 2014-07-13 NOTE — Progress Notes (Signed)
Echo performed. 

## 2014-07-20 ENCOUNTER — Telehealth: Payer: Self-pay | Admitting: *Deleted

## 2014-07-20 NOTE — Telephone Encounter (Signed)
S/w Edwina @ [phone] to Prior Auth Voltarten Gel 1%.  Would not cover topical medcication without prior step criteria.  Brought this Dr. Charlott Rakesoss's attention and stated pt already went back to [place] and probably picked up medication without using insurance.  Stated to not worry about it.

## 2014-10-05 ENCOUNTER — Encounter: Payer: Self-pay | Admitting: Internal Medicine

## 2014-10-05 ENCOUNTER — Ambulatory Visit (INDEPENDENT_AMBULATORY_CARE_PROVIDER_SITE_OTHER): Payer: Medicare Other | Admitting: Internal Medicine

## 2014-10-05 ENCOUNTER — Other Ambulatory Visit: Payer: Self-pay

## 2014-10-05 VITALS — BP 114/62 | HR 67 | Ht 64.7 in | Wt 118.4 lb

## 2014-10-05 DIAGNOSIS — I2510 Atherosclerotic heart disease of native coronary artery without angina pectoris: Secondary | ICD-10-CM | POA: Diagnosis not present

## 2014-10-05 DIAGNOSIS — D62 Acute posthemorrhagic anemia: Secondary | ICD-10-CM | POA: Diagnosis not present

## 2014-10-05 DIAGNOSIS — I350 Nonrheumatic aortic (valve) stenosis: Secondary | ICD-10-CM | POA: Diagnosis not present

## 2014-10-05 DIAGNOSIS — E559 Vitamin D deficiency, unspecified: Secondary | ICD-10-CM

## 2014-10-05 DIAGNOSIS — D500 Iron deficiency anemia secondary to blood loss (chronic): Secondary | ICD-10-CM

## 2014-10-05 LAB — CBC WITH DIFFERENTIAL/PLATELET
BASOS ABS: 0 10*3/uL (ref 0.0–0.1)
Basophils Relative: 0.7 % (ref 0.0–3.0)
EOS PCT: 2.8 % (ref 0.0–5.0)
Eosinophils Absolute: 0.2 10*3/uL (ref 0.0–0.7)
HEMATOCRIT: 34.7 % — AB (ref 36.0–46.0)
HEMOGLOBIN: 11.6 g/dL — AB (ref 12.0–15.0)
LYMPHS PCT: 24.1 % (ref 12.0–46.0)
Lymphs Abs: 1.4 10*3/uL (ref 0.7–4.0)
MCHC: 33.3 g/dL (ref 30.0–36.0)
MCV: 86.7 fl (ref 78.0–100.0)
Monocytes Absolute: 0.6 10*3/uL (ref 0.1–1.0)
Monocytes Relative: 9.9 % (ref 3.0–12.0)
NEUTROS ABS: 3.7 10*3/uL (ref 1.4–7.7)
Neutrophils Relative %: 62.5 % (ref 43.0–77.0)
Platelets: 274 10*3/uL (ref 150.0–400.0)
RBC: 4 Mil/uL (ref 3.87–5.11)
RDW: 13.8 % (ref 11.5–15.5)
WBC: 5.9 10*3/uL (ref 4.0–10.5)

## 2014-10-05 LAB — BASIC METABOLIC PANEL
BUN: 19 mg/dL (ref 6–23)
CALCIUM: 9 mg/dL (ref 8.4–10.5)
CHLORIDE: 108 meq/L (ref 96–112)
CO2: 24 meq/L (ref 19–32)
Creatinine, Ser: 1.12 mg/dL (ref 0.40–1.20)
GFR: 50.55 mL/min — ABNORMAL LOW (ref >60.00)
GLUCOSE: 107 mg/dL — AB (ref 70–99)
Potassium: 4.5 mEq/L (ref 3.5–5.1)
Sodium: 137 mEq/L (ref 135–145)

## 2014-10-05 LAB — MAGNESIUM: Magnesium: 2.3 mg/dL (ref 1.5–2.5)

## 2014-10-05 LAB — IRON: Iron: 60 ug/dL (ref 42–145)

## 2014-10-05 LAB — VITAMIN D 25 HYDROXY (VIT D DEFICIENCY, FRACTURES): VITD: 18.19 ng/mL — ABNORMAL LOW (ref 30.00–100.00)

## 2014-10-05 LAB — FERRITIN: Ferritin: 27 ng/mL (ref 10.0–291.0)

## 2014-10-05 NOTE — Progress Notes (Addendum)
Cardiology Office Note   Date:  10/05/2014   ID:  Starlit, Raburn 1941/02/16, MRN [id number]  PCP:  Neena Rhymes, MD  Cardiologist:   Dietrich Pates, MD   No chief complaint on file.  Pt presents for f/u of CAD History of Present Illness: Lisa Murillo is a 74 y.o. female with a history of  CAD (s/p IWMI in 1995 Underwent PTCA in [place]; Cath in 2009 showed : LM normal; LAD: calcified; LCx: 80% distal; RCA: 80% before PDA. Underwent PTCA/DES to RCA).  Also has a history of UGI bleeding, HTN, mild aortic stenosis (bicuspid valve)              Echo showed LVEF 65%  Moderate aortic stenosis  Mean gradient  of 20 mm Hg   Pt was seen in April of this year   Labs at that time were good  Lipids were very good LDL 71  HDL 49 Hgb was 10.9     Pt complains of increase stress.  Tired   Food slow to go down.   Current Outpatient Prescriptions  Medication Sig Dispense Refill  . amLODipine (NORVASC) 5 MG tablet TAKE 1 TABLET (5 MG TOTAL) BY MOUTH DAILY. 90 tablet 3  . aspirin 81 MG tablet Take 81 mg by mouth daily.      . diclofenac sodium (VOLTAREN) 1 % GEL Apply 2 g topically 4 (four) times daily. Use sparingly 100 g 3  . ezetimibe (ZETIA) 10 MG tablet TAKE 1 TABLET (10 MG TOTAL) BY MOUTH DAILY. 90 tablet 3  . metoprolol succinate (TOPROL-XL) 25 MG 24 hr tablet TAKE 1 TABLET (25 MG TOTAL) BY MOUTH DAILY. 90 tablet 3  . nitroGLYCERIN (NITROSTAT) 0.4 MG SL tablet Place 1 tablet (0.4 mg total) under the tongue as needed. 50 tablet 5  . pantoprazole (PROTONIX) 40 MG tablet Take 40 mg by mouth daily.    Bertram Gala Glycol-Propyl Glycol (SYSTANE OP) Apply to eye. Left eye-- the pt uses when her eyes are bothering her    . rosuvastatin (CRESTOR) 5 MG tablet TAKE 1 TABLET (5 MG TOTAL) BY MOUTH DAILY. 90 tablet 3   No current facility-administered medications for this visit.    Allergies:   Simvastatin   Past Medical History  Diagnosis Date  . Hypertension   . GERD  (gastroesophageal reflux disease)   . CAD (coronary artery disease)   . Hyperlipidemia   . Aortic stenosis   . Peptic ulcer disease   . Vertigo   . Osteoporosis     Past Surgical History  Procedure Laterality Date  . Ptca       Social History:  The patient  reports that she has never smoked. She does not have any smokeless tobacco history on file. She reports that she does not drink alcohol or use illicit drugs.   Family History:  The patient's family history is negative for Sudden death, Hypertension, Hyperlipidemia, Heart attack, and Diabetes.    ROS:  Please see the history of present illness. All other systems are reviewed and  Negative to the above problem except as noted.    PHYSICAL EXAM: VS:  BP 114/62 mmHg  Pulse 67  Ht 5' 4.7" (1.643 m)  Wt 118 lb 6.4 oz (53.706 kg)  BMI 19.90 kg/m2  GEN: Well nourished, well developed, in no acute distress HEENT: normal Neck: no JVD, carotid bruits, or masses Cardiac: RRR; Gr II/VI sytolic murmur LSB  , rubs, or gallops,no edema  Respiratory:  clear to auscultation bilaterally, normal work of breathing GI: soft, nontender, nondistended, + BS  No hepatomegaly  MS: no deformity Moving all extremities   Skin: warm and dry, no rash Neuro:  Strength and sensation are intact Psych: euthymic mood, full affect   EKG:  EKG is ordered today.  SR 57 bpm     Lipid Panel    Component Value Date/Time   CHOL 138 07/13/2014 1107   TRIG 88.0 07/13/2014 1107   HDL 49.20 07/13/2014 1107   CHOLHDL 3 07/13/2014 1107   VLDL 17.6 07/13/2014 1107   LDLCALC 71 07/13/2014 1107   LDLDIRECT 144.0 07/22/2010 0948      Wt Readings from Last 3 Encounters:  10/05/14 118 lb 6.4 oz (53.706 kg)  07/13/14 117 lb 6.4 oz (53.252 kg)  09/10/13 124 lb (56.246 kg)      ASSESSMENT AND PLAN:  1.  CAD  I am not convinced of angina  Keep on current regimen  2.  Aortic stenosis.  Prob mild to moderate by gradient  Follow with periodic echoes     Dizziness  Intermittent  Pt's food/fluid intake is not great.  I would recomm cutting back on amlodipne to 1/2  Check BMET  3.  Hx anemia  Will check CBC   4.  GI  Some problems with swallowing  I am not convinced of stricture  Appetite chronically down.  Check labs  Was seen by Dr Dulce Sellar in past  4 VIt D defic  Check level     Disposition:   FU with me depends on when she returns to [place] Signed, Dietrich Pates, MD  10/05/2014 12:47 PM    [facility] [address] [place], [place], [place]  [address] Phone: [phone]; Fax: [phone]

## 2014-10-05 NOTE — Patient Instructions (Signed)
Medication Instructions:  Your physician recommends that you continue on your current medications as directed. Please refer to the Current Medication list given to you today.   Labwork: Lab work to be done today--BMP, CBC, Magnesium, Iron,Ferritin, Vitamin D  Testing/Procedures: none  Follow-Up:  Call us when you are back to schedule follow up.

## 2014-10-06 ENCOUNTER — Other Ambulatory Visit: Payer: Self-pay | Admitting: Internal Medicine

## 2014-10-07 NOTE — Telephone Encounter (Signed)
Called to give lab results and provider instructions.  Left VM Returned call.  Gave patient results of lab's and instructed patient to take 1000 units of Vitamin D 3 daily as per Dr. Charlott Rakesoss's instructions

## 2014-10-08 NOTE — Telephone Encounter (Signed)
Per note 6.27.16 

## 2015-01-26 ENCOUNTER — Encounter: Payer: Self-pay | Admitting: Internal Medicine

## 2015-01-26 ENCOUNTER — Ambulatory Visit
Admission: RE | Admit: 2015-01-26 | Discharge: 2015-01-26 | Disposition: A | Discharge status: Home / Self Care | Payer: Medicare Other | Source: Ambulatory Visit | Attending: Internal Medicine | Admitting: Internal Medicine

## 2015-01-26 ENCOUNTER — Ambulatory Visit (INDEPENDENT_AMBULATORY_CARE_PROVIDER_SITE_OTHER): Payer: Medicare Other | Admitting: Internal Medicine

## 2015-01-26 VITALS — BP 126/66 | HR 70 | Wt 116.1 lb

## 2015-01-26 DIAGNOSIS — M810 Age-related osteoporosis without current pathological fracture: Secondary | ICD-10-CM

## 2015-01-26 DIAGNOSIS — I10 Essential (primary) hypertension: Secondary | ICD-10-CM

## 2015-01-26 DIAGNOSIS — R0989 Other specified symptoms and signs involving the circulatory and respiratory systems: Secondary | ICD-10-CM

## 2015-01-26 DIAGNOSIS — I2510 Atherosclerotic heart disease of native coronary artery without angina pectoris: Secondary | ICD-10-CM

## 2015-01-26 DIAGNOSIS — Z23 Encounter for immunization: Secondary | ICD-10-CM

## 2015-01-26 DIAGNOSIS — R918 Other nonspecific abnormal finding of lung field: Secondary | ICD-10-CM | POA: Diagnosis not present

## 2015-01-26 LAB — BASIC METABOLIC PANEL
BUN: 25 mg/dL (ref 7–25)
CALCIUM: 9.2 mg/dL (ref 8.6–10.4)
CO2: 22 mmol/L (ref 20–31)
Chloride: 108 mmol/L (ref 98–110)
Creat: 1.09 mg/dL — ABNORMAL HIGH (ref 0.60–0.93)
GLUCOSE: 99 mg/dL (ref 65–99)
Potassium: 4.5 mmol/L (ref 3.5–5.3)
SODIUM: 140 mmol/L (ref 135–146)

## 2015-01-26 LAB — CBC
HCT: 33.3 % — ABNORMAL LOW (ref 36.0–46.0)
Hemoglobin: 11.1 g/dL — ABNORMAL LOW (ref 12.0–15.0)
MCH: 28.9 pg (ref 26.0–34.0)
MCHC: 33.3 g/dL (ref 30.0–36.0)
MCV: 86.7 fL (ref 78.0–100.0)
MPV: 9.5 fL (ref 8.6–12.4)
Platelets: 264 10*3/uL (ref 150–400)
RBC: 3.84 MIL/uL — AB (ref 3.87–5.11)
RDW: 13.1 % (ref 11.5–15.5)
WBC: 4.3 10*3/uL (ref 4.0–10.5)

## 2015-01-26 NOTE — Patient Instructions (Signed)
Your physician recommends that you continue on your current medications as directed. Please refer to the Current Medication list given to you today. Your physician recommends that you return for lab work today (SED RATE, BMET, BNP, CBC)  A chest x-ray takes a picture of the organs and structures inside the chest, including the heart, lungs, and blood vessels. This test can show several things, including, whether the heart is enlarges; whether fluid is building up in the lungs.  When you plan to return to the area, please call the office to schedule an echocardiogram prior to seeing Dr. Tenny Crawoss.

## 2015-01-26 NOTE — Progress Notes (Signed)
Cardiology Office Note   Date:  02/01/2015   ID:  Renne CriglerMargaret H Murillo, DOB 1940-12-11, MRN [id number]  PCP:  Lisa RhymesKatherine Tabori, MD  Cardiologist:   Lisa PatesPaula Miley Blanchett, MD   No chief complaint on file.  F/U of CAD and HTN     History of Present Illness: [employer] FossaMargaret H Murillo is a 74 y.o. female with a history of   CAD (s/p IWMI in 1995 Underwent PTCA in [place]; Cath in 2009 showed : LM normal; LAD: calcified; LCx: 80% distal; RCA: 80% before PDA. Underwent PTCA/DES to RCA).  Also has a history of UGI bleeding, HTN, mild aortic stenosis (bicuspid valve)  She was last seen in cardiology clinic in June 2016                Since seen she doe not complain of CP  Breathing is OK Is under a lot of stress. Having problems with pains in L leg  Seen by ortho  Xrays  Told to wear brace.   Current Outpatient Prescriptions  Medication Sig Dispense Refill  . amLODipine (NORVASC) 5 MG tablet TAKE 1 TABLET BY MOUTH EVERY DAY 90 tablet 3  . aspirin 81 MG tablet Take 81 mg by mouth daily.      . CRESTOR 5 MG tablet TAKE 1 TABLET BY MOUTH EVERY DAY 90 tablet 2  . diclofenac sodium (VOLTAREN) 1 % GEL Apply 2 g topically 4 (four) times daily. Use sparingly 100 g 3  . metoprolol succinate (TOPROL-XL) 25 MG 24 hr tablet TAKE 1 TABLET BY MOUTH EVERY DAY 90 tablet 3  . NITROSTAT 0.4 MG SL tablet PLACE 1 TABLET UNDER THE TONGUE AS NEEDED 50 tablet 1  . pantoprazole (PROTONIX) 40 MG tablet TAKE 1 TABLET BY MOUTH EVERY DAY 90 tablet 1  . Polyethyl Glycol-Propyl Glycol (SYSTANE OP) Apply to eye. Left eye-- the pt uses when her eyes are bothering her    . ZETIA 10 MG tablet TAKE 1 TABLET BY MOUTH EVERY DAY 90 tablet 3   No current facility-administered medications for this visit.    Allergies:   Simvastatin   Past Medical History  Diagnosis Date  . Hypertension   . GERD (gastroesophageal reflux disease)   . CAD (coronary artery disease)   . Hyperlipidemia   . Aortic stenosis   . Peptic ulcer  disease   . Vertigo   . Osteoporosis     Past Surgical History  Procedure Laterality Date  . Ptca       Social History:  The patient  reports that she has never smoked. She does not have any smokeless tobacco history on file. She reports that she does not drink alcohol or use illicit drugs.   Family History:  The patient's family history is negative for Sudden death, Hypertension, Hyperlipidemia, Heart attack, and Diabetes.    ROS:  Please see the history of present illness. All other systems are reviewed and  Negative to the above problem except as noted.    PHYSICAL EXAM: VS:  BP 126/66 mmHg  Pulse 70  Wt 116 lb 1.9 oz (52.672 kg)  SpO2 98%  GEN: Well nourished, well developed, in no acute distress HEENT: normal Neck: no JVD, carotid bruits, or masses Cardiac: RRR; Gr II/VI systolic murmur at base. No rubs, or gallops,no edema  Respiratory:  clear to auscultation bilaterally, normal work of breathing GI: soft, nontender, nondistended, + BS  No hepatomegaly  MS: no deformity Moving all extremities   Skin: warm  and dry, no rash Neuro:  Strength and sensation are intact Psych: euthymic mood, full affect   EKG:  EKG is not ordered today.   Lipid Panel    Component Value Date/Time   CHOL 138 07/13/2014 1107   TRIG 88.0 07/13/2014 1107   HDL 49.20 07/13/2014 1107   CHOLHDL 3 07/13/2014 1107   VLDL 17.6 07/13/2014 1107   LDLCALC 71 07/13/2014 1107   LDLDIRECT 144.0 07/22/2010 0948      Wt Readings from Last 3 Encounters:  01/26/15 116 lb 1.9 oz (52.672 kg)  10/05/14 118 lb 6.4 oz (53.706 kg)  07/13/14 117 lb 6.4 oz (53.252 kg)      ASSESSMENT AND PLAN:  1.  CAD  I am not convinced of any active ischemia    2.  Aortic stenosis  Echo in April LVEF was 60 to 65%  AV with mean gradient of 20 mm Hg   (was 17 in 2014)  Will follow up with echo in spring    3.  HL  Lipids in April LDL was 71, HDL was 49  Keep on same regimen    4.  Anemia  Will check today     5  Ortho   Will review Xrays from [place] with radiology  Sounds like sciatica  May benefit from sports medicine eval.     Signed, Lisa Pates, MD  02/01/2015 4:50 PM    [facility] [address] [place], [place], [place]  [address] Phone: [phone]; Fax: [phone]

## 2015-01-27 LAB — SEDIMENTATION RATE: Sed Rate: 9 mm/hr (ref 0–30)

## 2015-01-27 LAB — BRAIN NATRIURETIC PEPTIDE: BRAIN NATRIURETIC PEPTIDE: 87.4 pg/mL (ref 0.0–100.0)

## 2015-04-29 ENCOUNTER — Telehealth: Payer: Self-pay | Admitting: Internal Medicine

## 2015-04-29 ENCOUNTER — Telehealth: Payer: Self-pay | Admitting: *Deleted

## 2015-04-29 MED ORDER — AMLODIPINE BESYLATE 5 MG PO TABS: 5.0000 mg | ORAL_TABLET | Freq: Every day | ORAL | Status: AC

## 2015-04-29 MED ORDER — METOPROLOL SUCCINATE ER 25 MG PO TB24: 25.0000 mg | ORAL_TABLET | Freq: Every day | ORAL | Status: DC

## 2015-04-29 MED ORDER — EZETIMIBE 10 MG PO TABS: 10.0000 mg | ORAL_TABLET | Freq: Every day | ORAL | Status: AC

## 2015-04-29 MED ORDER — ROSUVASTATIN CALCIUM 5 MG PO TABS: 5.0000 mg | ORAL_TABLET | Freq: Every day | ORAL | Status: AC

## 2015-04-29 NOTE — Telephone Encounter (Signed)
Pts daughter called Mother is switching pharmaices  From [facility] to [facility] She will need Rx for   Crestor  Zetia  Amlodipine  Metoprolol  Sent in to [facility]  Need tomorrow.

## 2015-04-29 NOTE — Telephone Encounter (Addendum)
Pricilla Riffle, MD at 04/29/2015 1:28 PM     Status: Signed       Expand All Collapse All   Pts daughter called Mother is switching pharmaices From [facility] to [facility] She will need Rx for  Crestor Zetia Amlodipine Metoprolol  Sent in to [facility] Need tomorrow.        Pharmacy was verified with patient's daughter.  [id number]

## 2015-08-02 ENCOUNTER — Other Ambulatory Visit: Payer: Self-pay

## 2015-08-02 DIAGNOSIS — I350 Nonrheumatic aortic (valve) stenosis: Secondary | ICD-10-CM

## 2015-08-03 ENCOUNTER — Ambulatory Visit (INDEPENDENT_AMBULATORY_CARE_PROVIDER_SITE_OTHER)
Admission: RE | Admit: 2015-08-03 | Discharge: 2015-08-03 | Disposition: A | Discharge status: Home / Self Care | Payer: Medicare Other | Source: Ambulatory Visit | Attending: Internal Medicine | Admitting: Internal Medicine

## 2015-08-03 ENCOUNTER — Other Ambulatory Visit: Payer: Self-pay | Admitting: Internal Medicine

## 2015-08-03 ENCOUNTER — Ambulatory Visit (HOSPITAL_BASED_OUTPATIENT_CLINIC_OR_DEPARTMENT_OTHER)
Admission: RE | Admit: 2015-08-03 | Discharge: 2015-08-03 | Disposition: A | Discharge status: Home / Self Care | Payer: Medicare Other | Source: Ambulatory Visit | Attending: Internal Medicine | Admitting: Internal Medicine

## 2015-08-03 ENCOUNTER — Ambulatory Visit (INDEPENDENT_AMBULATORY_CARE_PROVIDER_SITE_OTHER): Payer: Medicare Other | Admitting: Internal Medicine

## 2015-08-03 ENCOUNTER — Ambulatory Visit (HOSPITAL_COMMUNITY)
Admission: RE | Admit: 2015-08-03 | Discharge: 2015-08-03 | Disposition: A | Discharge status: Home / Self Care | Payer: Medicare Other | Source: Ambulatory Visit | Attending: Internal Medicine | Admitting: Internal Medicine

## 2015-08-03 ENCOUNTER — Other Ambulatory Visit: Payer: Self-pay | Admitting: *Deleted

## 2015-08-03 DIAGNOSIS — M810 Age-related osteoporosis without current pathological fracture: Secondary | ICD-10-CM

## 2015-08-03 DIAGNOSIS — I2510 Atherosclerotic heart disease of native coronary artery without angina pectoris: Secondary | ICD-10-CM

## 2015-08-03 DIAGNOSIS — I119 Hypertensive heart disease without heart failure: Secondary | ICD-10-CM | POA: Insufficient documentation

## 2015-08-03 DIAGNOSIS — I5023 Acute on chronic systolic (congestive) heart failure: Secondary | ICD-10-CM | POA: Diagnosis not present

## 2015-08-03 DIAGNOSIS — E785 Hyperlipidemia, unspecified: Secondary | ICD-10-CM

## 2015-08-03 DIAGNOSIS — I350 Nonrheumatic aortic (valve) stenosis: Secondary | ICD-10-CM

## 2015-08-03 DIAGNOSIS — I10 Essential (primary) hypertension: Secondary | ICD-10-CM | POA: Diagnosis not present

## 2015-08-03 DIAGNOSIS — I340 Nonrheumatic mitral (valve) insufficiency: Secondary | ICD-10-CM | POA: Diagnosis not present

## 2015-08-03 DIAGNOSIS — R0600 Dyspnea, unspecified: Secondary | ICD-10-CM

## 2015-08-03 LAB — COMPREHENSIVE METABOLIC PANEL
ALK PHOS: 58 U/L (ref 38–126)
ALT: 14 U/L (ref 14–54)
AST: 22 U/L (ref 15–41)
Albumin: 3.6 g/dL (ref 3.5–5.0)
Anion gap: 6 (ref 5–15)
BILIRUBIN TOTAL: 0.3 mg/dL (ref 0.3–1.2)
BUN: 18 mg/dL (ref 6–20)
CALCIUM: 9.3 mg/dL (ref 8.9–10.3)
CO2: 23 mmol/L (ref 22–32)
Chloride: 110 mmol/L (ref 101–111)
Creatinine, Ser: 0.99 mg/dL (ref 0.44–1.00)
GFR calc Af Amer: >60 mL/min (ref >60)
GFR, EST NON AFRICAN AMERICAN: 55 mL/min — AB (ref >60)
GLUCOSE: 106 mg/dL — AB (ref 65–99)
POTASSIUM: 4.6 mmol/L (ref 3.5–5.1)
Sodium: 139 mmol/L (ref 135–145)
TOTAL PROTEIN: 6.7 g/dL (ref 6.5–8.1)

## 2015-08-03 LAB — LIPID PANEL
Cholesterol: 157 mg/dL (ref 0–200)
HDL: 45 mg/dL (ref >40)
LDL CALC: 86 mg/dL (ref 0–99)
Total CHOL/HDL Ratio: 3.5 RATIO
Triglycerides: 128 mg/dL (ref <150)
VLDL: 26 mg/dL (ref 0–40)

## 2015-08-03 LAB — CBC
HEMATOCRIT: 33.2 % — AB (ref 36.0–46.0)
HEMOGLOBIN: 10.5 g/dL — AB (ref 12.0–15.0)
MCH: 28.7 pg (ref 26.0–34.0)
MCHC: 31.6 g/dL (ref 30.0–36.0)
MCV: 90.7 fL (ref 78.0–100.0)
Platelets: 266 10*3/uL (ref 150–400)
RBC: 3.66 MIL/uL — AB (ref 3.87–5.11)
RDW: 12.8 % (ref 11.5–15.5)
WBC: 5.4 10*3/uL (ref 4.0–10.5)

## 2015-08-03 LAB — BRAIN NATRIURETIC PEPTIDE: B NATRIURETIC PEPTIDE 5: 197.8 pg/mL — AB (ref 0.0–100.0)

## 2015-08-03 LAB — TSH: TSH: 3.8 u[IU]/mL (ref 0.350–4.500)

## 2015-08-03 NOTE — Progress Notes (Signed)
*  PRELIMINARY RESULTS* Echocardiogram 2D Echocardiogram has been performed.  Lisa Murillo, Lisa Murillo 08/03/2015, 11:04 AM

## 2015-08-03 NOTE — Progress Notes (Signed)
Labs drawn per Dr Tenny Crawoss

## 2015-08-03 NOTE — Progress Notes (Signed)
Cardiology Office Note   Date:  08/03/2015   ID:  Lisa CriglerMargaret H Murillo, DOB August 05, 1940, MRN [id number]  PCP:  Neena RhymesKatherine Tabori, MD  Cardiologist:   Dietrich PatesPaula Brantley Naser, MD    F?U of CAD     History of Present Illness: [employer] FossaMargaret H Murillo is a 75 y.o. female with a history of CAD (s/p IWMI 1995 PTCA in [place]; s/p Cath in 2009:  LM normal  LAD calcified; LCx 80% distal; RCA: 80% before PDA.  Underwent PTCA/DES to RCA)  Pt also has a history of mild AS, HTN, UGI bleeding I saw the pt in clinic last October    SInce seen she denies CP  Breathing is stable   Had EGD last week in [place]  FOund to have ulcer  Back on protonix.   One episode about 1 month ago of dizziness  BP OK at time  Improved  Was dizzy when got out of bed   Now has mild unsteadiness No falls  Appetite down     Outpatient Prescriptions Prior to Visit  Medication Sig Dispense Refill  . amLODipine (NORVASC) 5 MG tablet Take 1 tablet (5 mg total) by mouth daily. 90 tablet 3  . aspirin 81 MG tablet Take 81 mg by mouth daily.      . diclofenac sodium (VOLTAREN) 1 % GEL Apply 2 g topically 4 (four) times daily. Use sparingly 100 g 3  . ezetimibe (ZETIA) 10 MG tablet Take 1 tablet (10 mg total) by mouth daily. 90 tablet 3  . metoprolol succinate (TOPROL-XL) 25 MG 24 hr tablet Take 1 tablet (25 mg total) by mouth daily. 90 tablet 3  . NITROSTAT 0.4 MG SL tablet PLACE 1 TABLET UNDER THE TONGUE AS NEEDED 50 tablet 1  . pantoprazole (PROTONIX) 40 MG tablet TAKE 1 TABLET BY MOUTH EVERY DAY 90 tablet 1  . Polyethyl Glycol-Propyl Glycol (SYSTANE OP) Apply to eye. Left eye-- the pt uses when her eyes are bothering her    . rosuvastatin (CRESTOR) 5 MG tablet Take 1 tablet (5 mg total) by mouth daily. 90 tablet 3   No facility-administered medications prior to visit.     Allergies:   Simvastatin   Past Medical History  Diagnosis Date  . Hypertension   . GERD (gastroesophageal reflux disease)   . CAD (coronary artery disease)   .  Hyperlipidemia   . Aortic stenosis   . Peptic ulcer disease   . Vertigo   . Osteoporosis     Past Surgical History  Procedure Laterality Date  . Ptca       Social History:  The patient  reports that she has never smoked. She does not have any smokeless tobacco history on file. She reports that she does not drink alcohol or use illicit drugs.   Family History:  The patient's family history is negative for Sudden death, Hypertension, Hyperlipidemia, Heart attack, and Diabetes.    ROS:  Please see the history of present illness. All other systems are reviewed and  Negative to the above problem except as noted.    PHYSICAL EXAM: VS:BP 151/78, 170/70, 145/86    P 70 GEN: Well nourished, well developed, in no acute distress HEENT: normal Neck: no JVD, carotid bruits, or masses Cardiac: RRR; Gr III/VI systolic murmur LSB to base rubs, or gallops,no edema  Chest  Tender to palp lower rib cage   Respiratory:  clear to auscultation bilaterally, normal work of breathing GI: soft, nontender, nondistended, + BS  No hepatomegaly  MS: no deformity Moving all extremities   Skin: warm and dry, no rash Neuro:  Strength and sensation are intact Psych: euthymic mood, full affect   EKG:  EKG is not  ordered today.   Lipid Panel    Component Value Date/Time   CHOL 138 07/13/2014 1107   TRIG 88.0 07/13/2014 1107   HDL 49.20 07/13/2014 1107   CHOLHDL 3 07/13/2014 1107   VLDL 17.6 07/13/2014 1107   LDLCALC 71 07/13/2014 1107   LDLDIRECT 144.0 07/22/2010 0948      Wt Readings from Last 3 Encounters:  01/26/15 116 lb 1.9 oz (52.672 kg)  10/05/14 118 lb 6.4 oz (53.706 kg)  07/13/14 117 lb 6.4 oz (53.252 kg)      ASSESSMENT AND PLAN:  1  CAD  No symptoms to suggest angina  Check labs  2.  AS  Echo today show mild increase in mean gradient to 25 mm Hg  AS appears mild to mod F/U echo in 1 year   3  HL Will check  Lipids today  LDL was 113 in [place]  4.  Anemia  Chedk CBC with  ulcer   5  GI  Pt had EGD last week showing gastric ulcer  Keep on PRotonix    6  Osteoporosis  Bone density today      Signed, Dietrich Pates, MD  08/03/2015 9:49 AM    [facility] [address] [place], [place], [place]  [address] Phone: [phone]; Fax: [phone]

## 2015-08-04 ENCOUNTER — Encounter: Payer: Self-pay | Admitting: Internal Medicine

## 2015-08-04 LAB — VITAMIN D 25 HYDROXY (VIT D DEFICIENCY, FRACTURES): VIT D 25 HYDROXY: 15.2 ng/mL — AB (ref 30.0–100.0)

## 2015-08-05 ENCOUNTER — Encounter: Payer: Self-pay | Admitting: Internal Medicine

## 2016-04-25 ENCOUNTER — Other Ambulatory Visit: Payer: Self-pay | Admitting: Internal Medicine

## 2016-06-20 ENCOUNTER — Other Ambulatory Visit: Payer: Self-pay | Admitting: Internal Medicine

## 2016-07-19 ENCOUNTER — Telehealth: Payer: Self-pay

## 2016-07-19 DIAGNOSIS — I350 Nonrheumatic aortic (valve) stenosis: Secondary | ICD-10-CM

## 2016-07-19 NOTE — Telephone Encounter (Addendum)
Per Dr. Tenny Craw, ordered ECHO for aortic stenosis. ECHO is scheduled 4/18 per Dr. Tenny Craw.  Dr. Tenny Craw states she will speak to the patient and instruct her to arrive by 0800.   Copy of note given to ECHO scheduler to monitor schedule to see if any later appointments on 4/18 open (patient will be driving from [facility] that AM) OR if it can be done at [facility] that Wednesday.

## 2016-07-26 ENCOUNTER — Telehealth (HOSPITAL_COMMUNITY): Payer: Self-pay | Admitting: Internal Medicine

## 2016-07-26 ENCOUNTER — Ambulatory Visit (HOSPITAL_COMMUNITY): Admission: RE | Admit: 2016-07-26 | Payer: Medicare Other | Source: Ambulatory Visit

## 2016-07-26 ENCOUNTER — Other Ambulatory Visit (HOSPITAL_COMMUNITY): Payer: Self-pay

## 2016-07-31 ENCOUNTER — Other Ambulatory Visit: Payer: Self-pay | Admitting: *Deleted

## 2016-07-31 MED ORDER — METOPROLOL SUCCINATE ER 25 MG PO TB24: 25.0000 mg | ORAL_TABLET | Freq: Every day | ORAL | 2 refills | Status: AC

## 2016-08-02 ENCOUNTER — Ambulatory Visit (HOSPITAL_COMMUNITY): Payer: Medicare Other

## 2016-08-04 ENCOUNTER — Encounter: Payer: Self-pay | Admitting: Internal Medicine

## 2019-06-09 DEATH — deceased
